# Patient Record
Sex: Male | Born: 1960 | Race: Black or African American | Hispanic: No | Marital: Single | Smoking: Never smoker
Health system: Southern US, Community
[De-identification: ages and names within clinical notes are randomized; demographics above are authoritative.]

## PROBLEM LIST (undated history)

## (undated) DIAGNOSIS — S76112A Strain of left quadriceps muscle, fascia and tendon, initial encounter: Secondary | ICD-10-CM

## (undated) DIAGNOSIS — G4730 Sleep apnea, unspecified: Secondary | ICD-10-CM

## (undated) DIAGNOSIS — I10 Essential (primary) hypertension: Secondary | ICD-10-CM

## (undated) DIAGNOSIS — S86891A Other injury of other muscle(s) and tendon(s) at lower leg level, right leg, initial encounter: Secondary | ICD-10-CM

## (undated) DIAGNOSIS — M549 Dorsalgia, unspecified: Secondary | ICD-10-CM

## (undated) HISTORY — PX: OTHER SURGICAL HISTORY: SHX169

## (undated) HISTORY — PX: KNEE SURGERY: SHX244

---

## 2008-02-23 ENCOUNTER — Emergency Department (HOSPITAL_COMMUNITY): Admission: EM | Admit: 2008-02-23 | Discharge: 2008-02-23 | Payer: Self-pay | Admitting: Emergency Medicine

## 2011-06-01 ENCOUNTER — Emergency Department (HOSPITAL_COMMUNITY)
Admission: EM | Admit: 2011-06-01 | Discharge: 2011-06-01 | Disposition: A | Discharge status: Home / Self Care | Payer: Medicaid - Out of State | Attending: Emergency Medicine | Admitting: Emergency Medicine

## 2011-06-01 ENCOUNTER — Emergency Department (HOSPITAL_COMMUNITY): Payer: Medicaid - Out of State

## 2011-06-01 DIAGNOSIS — M25469 Effusion, unspecified knee: Secondary | ICD-10-CM | POA: Insufficient documentation

## 2011-06-01 DIAGNOSIS — X500XXA Overexertion from strenuous movement or load, initial encounter: Secondary | ICD-10-CM | POA: Insufficient documentation

## 2011-06-01 DIAGNOSIS — S8990XA Unspecified injury of unspecified lower leg, initial encounter: Secondary | ICD-10-CM | POA: Insufficient documentation

## 2011-06-01 DIAGNOSIS — S99929A Unspecified injury of unspecified foot, initial encounter: Secondary | ICD-10-CM | POA: Insufficient documentation

## 2011-06-01 DIAGNOSIS — M79609 Pain in unspecified limb: Secondary | ICD-10-CM | POA: Insufficient documentation

## 2011-06-01 DIAGNOSIS — M25569 Pain in unspecified knee: Secondary | ICD-10-CM | POA: Insufficient documentation

## 2011-06-01 DIAGNOSIS — IMO0002 Reserved for concepts with insufficient information to code with codable children: Secondary | ICD-10-CM | POA: Insufficient documentation

## 2013-11-27 ENCOUNTER — Emergency Department (HOSPITAL_COMMUNITY)
Admission: EM | Admit: 2013-11-27 | Discharge: 2013-11-28 | Disposition: A | Discharge status: Home / Self Care | Payer: Medicaid - Out of State | Attending: Emergency Medicine | Admitting: Emergency Medicine

## 2013-11-27 ENCOUNTER — Encounter (HOSPITAL_COMMUNITY): Payer: Self-pay | Admitting: Emergency Medicine

## 2013-11-27 DIAGNOSIS — I10 Essential (primary) hypertension: Secondary | ICD-10-CM | POA: Insufficient documentation

## 2013-11-27 DIAGNOSIS — H6120 Impacted cerumen, unspecified ear: Secondary | ICD-10-CM | POA: Insufficient documentation

## 2013-11-27 DIAGNOSIS — J3489 Other specified disorders of nose and nasal sinuses: Secondary | ICD-10-CM | POA: Insufficient documentation

## 2013-11-27 DIAGNOSIS — Z79899 Other long term (current) drug therapy: Secondary | ICD-10-CM | POA: Insufficient documentation

## 2013-11-27 DIAGNOSIS — H6690 Otitis media, unspecified, unspecified ear: Secondary | ICD-10-CM

## 2013-11-27 HISTORY — DX: Essential (primary) hypertension: I10

## 2013-11-27 HISTORY — DX: Dorsalgia, unspecified: M54.9

## 2013-11-27 NOTE — ED Notes (Signed)
The patient said he has been working in the yard all day.  He said all of a sudden he started having ear pain.  It started as an ache now it is a throbbing, constant pain.  The patient rates his pain 8/10.  Patient denies injury to the left ear.  I did look in his ear, the right looks inflamed and the left I could not see well in the ear due to skin or wax in the ear.

## 2013-11-28 MED ORDER — ANTIPYRINE-BENZOCAINE 5.4-1.4 % OT SOLN: 3.0000 [drp] | OTIC | Status: DC | PRN

## 2013-11-28 MED ORDER — AMOXICILLIN 500 MG PO CAPS: 500.0000 mg | ORAL_CAPSULE | Freq: Three times a day (TID) | ORAL | Status: DC

## 2013-11-28 NOTE — Discharge Instructions (Signed)
Take antibiotic as prescribed.  Try auralgan for ear pain.  You can also take tylenol and/or motrin.  Return to ER if your pain worsens or you develop associated fever.

## 2013-11-28 NOTE — ED Provider Notes (Signed)
CSN: [id number]     Arrival date & time 11/27/13  2256 History   First MD Initiated Contact with Patient 11/27/13 2346     Chief Complaint  Patient presents with  . Otalgia     (Consider location/radiation/quality/duration/timing/severity/associated sxs/prior Treatment) HPI History provided by pt.   Pt had acute onset pain in right inner ear ~3.5 hours ago.  Associated w/ ear congestion and mild nasal congestion.  Denies fever, sore throat, cough, hearing impairment, otorrhea.  Denies trauma to ear but uses qtips.  Has never had these sx in the past.  Past Medical History  Diagnosis Date  . Hypertension   . Back pain    Past Surgical History  Procedure Laterality Date  . Heel spurs    . Plantar fascitis    . Knee surgery     History reviewed. No pertinent family history. History  Substance Use Topics  . Smoking status: Never Smoker   . Smokeless tobacco: Never Used  . Alcohol Use: No    Review of Systems  All other systems reviewed and are negative.     Allergies  Review of patient's allergies indicates no known allergies.  Home Medications   Current Outpatient Rx  Name  Route  Sig  Dispense  Refill  . lisinopril (PRINIVIL,ZESTRIL) 10 MG tablet   Oral   Take 10 mg by mouth daily.         . naproxen sodium (ANAPROX) 220 MG tablet   Oral   Take 220 mg by mouth 2 (two) times daily as needed (for pain).          BP 137/95  Pulse 84  Temp(Src) 98 F (36.7 C) (Oral)  Resp 18  Ht 5\' 9"  (1.753 m)  Wt 250 lb (113.399 kg)  BMI 36.90 kg/m2  SpO2 97% Physical Exam  Nursing note and vitals reviewed. Constitutional: He is oriented to person, place, and time. He appears well-developed and well-nourished. No distress.  HENT:  Head: Normocephalic and atraumatic.  Mouth/Throat: Oropharynx is clear and moist. No oropharyngeal exudate.  L TM and EAC nml.  Large amt cerumen R ear but when removed, TM deeply erythematous, particularly at 12 o'clock.  No sinus  tenderness  Eyes:  Normal appearance  Neck: Normal range of motion.  Cardiovascular: Normal rate.   Pulmonary/Chest: Effort normal and breath sounds normal.  Musculoskeletal: Normal range of motion.  Neurological: He is alert and oriented to person, place, and time.  Psychiatric: He has a normal mood and affect. His behavior is normal.    ED Course  Procedures (including critical care time) Labs Review Labs Reviewed - No data to display Imaging Review No results found.   EKG Interpretation None      MDM   Final diagnoses:  Otitis media    53yo M presents w/ non-traumatic R ear pain.  Exam consistent w/ AOM.  Prescribed amoxicillin and auralgan.  Recommended that he supplement for pain w/ tylenol/motrin prn. Return precautions discussed.     Arie SabinaCatherine E Lisabeth Mian, PA-C 11/28/13 1049

## 2013-11-29 NOTE — ED Provider Notes (Signed)
Medical screening examination/treatment/procedure(s) were performed by non-physician practitioner and as supervising physician I was immediately available for consultation/collaboration.   EKG Interpretation None        Loren Raceravid Lenward Able, MD 11/29/13 [id number]

## 2014-03-30 ENCOUNTER — Encounter (HOSPITAL_COMMUNITY): Payer: Self-pay | Admitting: Emergency Medicine

## 2014-03-30 ENCOUNTER — Emergency Department (HOSPITAL_COMMUNITY)
Admission: EM | Admit: 2014-03-30 | Discharge: 2014-03-30 | Disposition: A | Discharge status: Home / Self Care | Payer: Medicaid - Out of State | Attending: Emergency Medicine | Admitting: Emergency Medicine

## 2014-03-30 DIAGNOSIS — M545 Low back pain, unspecified: Secondary | ICD-10-CM | POA: Insufficient documentation

## 2014-03-30 DIAGNOSIS — I10 Essential (primary) hypertension: Secondary | ICD-10-CM | POA: Insufficient documentation

## 2014-03-30 MED ORDER — METHOCARBAMOL 500 MG PO TABS: 500.0000 mg | ORAL_TABLET | Freq: Two times a day (BID) | ORAL | Status: DC

## 2014-03-30 NOTE — ED Notes (Signed)
Pt presents with Right hip pain x4 days. Pt states he had XR's completed in [place] at his PCP but is here visiting. Pt drove here from [place] approx 1 week ago.

## 2014-03-30 NOTE — ED Provider Notes (Signed)
CSN: [id number]     Arrival date & time 03/30/14  1714 History  This chart was scribed for non-physician practitioner, Santiago GladHeather Kiyra Slaubaugh, PA-C working with Doug SouSam Jacubowitz, MD by Greggory StallionKayla Andersen, ED scribe. This patient was seen in room TR11C/TR11C and the patient's care was started at 6:46 PM.   Chief Complaint  Patient presents with  . Back Pain   The history is provided by the patient. No language interpreter was used.   HPI Comments: Logan Mccarthy is a 53 y.o. male who presents to the Emergency Department complaining of gradual onset right lower back pain that started about one week ago. Pain does not radiate. Denies injury but states he has been doing a lot of heavy lifting lately. States he had similar pain when he was 53 years old. Movement worsens the pain and causes it to be sharp. Laying on his back in a certain position relieves some pain. Pt has taken aleve with no relief. Denies dysuria, urinary frequency, hematuria, bowel or bladder incontinence, weakness, numbness or tingling.  No history of Cancer or IVDU.  Past Medical History  Diagnosis Date  . Hypertension   . Back pain    Past Surgical History  Procedure Laterality Date  . Heel spurs    . Plantar fascitis    . Knee surgery     History reviewed. No pertinent family history. History  Substance Use Topics  . Smoking status: Never Smoker   . Smokeless tobacco: Never Used  . Alcohol Use: No    Review of Systems  Genitourinary: Negative for dysuria, frequency and hematuria.       Negative for bowel or bladder incontinence.  Musculoskeletal: Positive for back pain.  Neurological: Negative for weakness and numbness.  All other systems reviewed and are negative.  Allergies  Review of patient's allergies indicates no known allergies.  Home Medications   Prior to Admission medications   Not on File   BP 129/72  Pulse 78  Temp(Src) 98.1 F (36.7 C) (Oral)  Resp 20  SpO2 97%  Physical Exam  Nursing note and  vitals reviewed. Constitutional: He appears well-developed and well-nourished.  HENT:  Head: Normocephalic and atraumatic.  Mouth/Throat: Oropharynx is clear and moist.  Eyes: EOM are normal.  Neck: Normal range of motion. Neck supple.  Cardiovascular: Normal rate, regular rhythm and normal heart sounds.   Pulmonary/Chest: Effort normal and breath sounds normal. He has no wheezes.  Musculoskeletal: Normal range of motion.  No tenderness to palpation of cervical, thoracic or lumbar spine. No tenderness to palpation of paraspinal muscles. Muscle strength normal. Distal sensation of both feet intact.  Neurological: He is alert. Gait normal.  Reflex Scores:      Patellar reflexes are 2+ on the right side and 2+ on the left side. Skin: Skin is warm and dry.  Psychiatric: He has a normal mood and affect. His behavior is normal.    ED Course  Procedures (including critical care time)  DIAGNOSTIC STUDIES: Oxygen Saturation is 97% on RA, normal by my interpretation.    COORDINATION OF CARE: 6:51 PM-Discussed treatment plan which includes a muscle relaxer and continuing aleve with pt at bedside and pt agreed to plan.   Labs Review Labs Reviewed - No data to display  Imaging Review No results found.   EKG Interpretation None      MDM   Final diagnoses:  None   MDM Number of Diagnoses or Management Options Right-sided low back pain without sciatica:  Patient  with back pain.  No neurological deficits and normal neuro exam.  Patient ambulating without difficulty.  No loss of bowel or bladder control.  No concern for cauda equina.  No fever, night sweats, weight loss, h/o cancer, IVDU.  RICE protocol and pain medicine indicated and discussed with patient.  Return precautions given.   I personally performed the services described in this documentation, which was scribed in my presence. The recorded information has been reviewed and is accurate.  Santiago Glad, PA-C 03/30/14  [phone]

## 2014-03-30 NOTE — Discharge Instructions (Signed)
Take muscle relaxer as needed.  Do not drive or operate heavy machinery for 4-6 hours after taking medication. °

## 2014-03-30 NOTE — ED Notes (Signed)
Patient discharged with all personal belongings. 

## 2014-03-30 NOTE — ED Notes (Signed)
Pt c/o pain to right lower back x's 8 days.  Pt also just drove from [place] and this made pain worse.  Pt denies tingling or numbness in legs.

## 2014-03-31 NOTE — ED Provider Notes (Signed)
Medical screening examination/treatment/procedure(s) were performed by non-physician practitioner and as supervising physician I was immediately available for consultation/collaboration.   EKG Interpretation None       Doug SouSam Jerusalem Brownstein, MD 03/31/14 0127

## 2014-08-17 DIAGNOSIS — Z86711 Personal history of pulmonary embolism: Secondary | ICD-10-CM

## 2014-08-17 HISTORY — DX: Personal history of pulmonary embolism: Z86.711

## 2015-06-21 ENCOUNTER — Encounter (HOSPITAL_COMMUNITY): Payer: Self-pay | Admitting: *Deleted

## 2015-06-21 ENCOUNTER — Inpatient Hospital Stay (HOSPITAL_COMMUNITY)
Admission: EM | Admit: 2015-06-21 | Discharge: 2015-06-24 | DRG: 176 | Disposition: A | Discharge status: Home / Self Care | Payer: Medicaid - Out of State | Attending: Internal Medicine | Admitting: Internal Medicine

## 2015-06-21 ENCOUNTER — Emergency Department (HOSPITAL_COMMUNITY): Payer: Medicaid - Out of State

## 2015-06-21 DIAGNOSIS — G4733 Obstructive sleep apnea (adult) (pediatric): Secondary | ICD-10-CM | POA: Diagnosis present

## 2015-06-21 DIAGNOSIS — Z79899 Other long term (current) drug therapy: Secondary | ICD-10-CM

## 2015-06-21 DIAGNOSIS — G4730 Sleep apnea, unspecified: Secondary | ICD-10-CM | POA: Diagnosis present

## 2015-06-21 DIAGNOSIS — N189 Chronic kidney disease, unspecified: Secondary | ICD-10-CM | POA: Diagnosis present

## 2015-06-21 DIAGNOSIS — R7989 Other specified abnormal findings of blood chemistry: Secondary | ICD-10-CM | POA: Diagnosis present

## 2015-06-21 DIAGNOSIS — E860 Dehydration: Secondary | ICD-10-CM | POA: Diagnosis present

## 2015-06-21 DIAGNOSIS — R778 Other specified abnormalities of plasma proteins: Secondary | ICD-10-CM | POA: Diagnosis present

## 2015-06-21 DIAGNOSIS — G894 Chronic pain syndrome: Secondary | ICD-10-CM | POA: Diagnosis present

## 2015-06-21 DIAGNOSIS — I2692 Saddle embolus of pulmonary artery without acute cor pulmonale: Principal | ICD-10-CM | POA: Diagnosis present

## 2015-06-21 DIAGNOSIS — R079 Chest pain, unspecified: Secondary | ICD-10-CM | POA: Diagnosis present

## 2015-06-21 DIAGNOSIS — I82403 Acute embolism and thrombosis of unspecified deep veins of lower extremity, bilateral: Secondary | ICD-10-CM | POA: Diagnosis present

## 2015-06-21 DIAGNOSIS — I10 Essential (primary) hypertension: Secondary | ICD-10-CM | POA: Diagnosis present

## 2015-06-21 DIAGNOSIS — M79606 Pain in leg, unspecified: Secondary | ICD-10-CM

## 2015-06-21 DIAGNOSIS — I129 Hypertensive chronic kidney disease with stage 1 through stage 4 chronic kidney disease, or unspecified chronic kidney disease: Secondary | ICD-10-CM | POA: Diagnosis present

## 2015-06-21 DIAGNOSIS — R0602 Shortness of breath: Secondary | ICD-10-CM | POA: Diagnosis present

## 2015-06-21 DIAGNOSIS — N179 Acute kidney failure, unspecified: Secondary | ICD-10-CM | POA: Diagnosis present

## 2015-06-21 DIAGNOSIS — M549 Dorsalgia, unspecified: Secondary | ICD-10-CM | POA: Diagnosis present

## 2015-06-21 DIAGNOSIS — R0600 Dyspnea, unspecified: Secondary | ICD-10-CM

## 2015-06-21 DIAGNOSIS — I2699 Other pulmonary embolism without acute cor pulmonale: Secondary | ICD-10-CM | POA: Insufficient documentation

## 2015-06-21 HISTORY — DX: Sleep apnea, unspecified: G47.30

## 2015-06-21 LAB — CBC
HCT: 42.8 % (ref 39.0–52.0)
Hemoglobin: 14.6 g/dL (ref 13.0–17.0)
MCH: 30.2 pg (ref 26.0–34.0)
MCHC: 34.1 g/dL (ref 30.0–36.0)
MCV: 88.6 fL (ref 78.0–100.0)
Platelets: 125 10*3/uL — ABNORMAL LOW (ref 150–400)
RBC: 4.83 MIL/uL (ref 4.22–5.81)
RDW: 13.4 % (ref 11.5–15.5)
WBC: 10.2 10*3/uL (ref 4.0–10.5)

## 2015-06-21 LAB — I-STAT TROPONIN, ED: Troponin i, poc: 0.11 ng/mL (ref 0.00–0.08)

## 2015-06-21 LAB — D-DIMER, QUANTITATIVE (NOT AT ARMC): D-Dimer, Quant: >20 ug/mL-FEU — ABNORMAL HIGH (ref 0.00–0.48)

## 2015-06-21 LAB — BASIC METABOLIC PANEL
Anion gap: 15 (ref 5–15)
BUN: 16 mg/dL (ref 6–20)
CO2: 26 mmol/L (ref 22–32)
Calcium: 9.8 mg/dL (ref 8.9–10.3)
Chloride: 99 mmol/L — ABNORMAL LOW (ref 101–111)
Creatinine, Ser: 1.36 mg/dL — ABNORMAL HIGH (ref 0.61–1.24)
GFR calc Af Amer: >60 mL/min (ref >60)
GFR calc non Af Amer: 58 mL/min — ABNORMAL LOW (ref >60)
Glucose, Bld: 143 mg/dL — ABNORMAL HIGH (ref 65–99)
Potassium: 4.6 mmol/L (ref 3.5–5.1)
Sodium: 140 mmol/L (ref 135–145)

## 2015-06-21 LAB — SEDIMENTATION RATE: Sed Rate: 1 mm/hr (ref 0–16)

## 2015-06-21 LAB — TROPONIN I: Troponin I: 0.13 ng/mL — ABNORMAL HIGH (ref <0.031)

## 2015-06-21 MED ORDER — ASPIRIN 81 MG PO CHEW
324.0000 mg | CHEWABLE_TABLET | Freq: Once | ORAL | Status: AC
  Administered 2015-06-21: 324 mg via ORAL
  Filled 2015-06-21: qty 4

## 2015-06-21 MED ORDER — NITROGLYCERIN 0.4 MG SL SUBL
0.4000 mg | SUBLINGUAL_TABLET | SUBLINGUAL | Status: DC | PRN
  Administered 2015-06-21: 0.4 mg via SUBLINGUAL
  Filled 2015-06-21: qty 1
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
## 2015-06-21 NOTE — ED Provider Notes (Addendum)
11:06 PM  Took over patient for the last provider. Patient 54 year old male with hypertension increasing shortness of breath and dyspnea on walking. Patient takes long plane flights. D-dimer pending. Initially mildly elevated troponin. I repeated EKG. On EKG looks like  there is ST elevations in V2 to V3.. [facility]. They agree. I would also like to scan patient given his risk factors, troponin, an elevated d-dimer. They recommended holding off on the scan currently so he does not receive two dye loads.  Patient will received heparin either way. Patient is chest pain-free right now. I'm still concerned about a pulmonary embolism but agree with cardiology recommendation abut avoidign renal damage by double dye load.   11:53 PM Cardiac he saw patient. They agreed to go ahead with the CT PE study. We'll plan the study. In the meantime we'll heparinize patient.    Courteney Randall AnLyn Mackuen, MD 06/21/15 2309  Courteney Randall AnLyn Mackuen, MD 06/21/15 2354  12:17 AM Patient refusing IV placement. IV not large enough for CT.  Will treat with heparin PE admit for VQ scan with cards following.   Courteney Randall AnLyn Mackuen, MD 06/22/15 0020

## 2015-06-21 NOTE — ED Provider Notes (Signed)
CSN: [id number]     Arrival date & time 06/21/15  1837 History   First MD Initiated Contact with Patient 06/21/15 1903     Chief Complaint  Patient presents with  . Shortness of Breath  . Chest Pain      HPI  Patient presents for evaluation of shortness of breath. He states that he was wife normally walk at country [address], about a three-quarter of a mile loop.  He normally walks 2-3 labs without difficulty. For the last 2-3 days he's had difficulty with his [place] shortness of breath. States it limits him he has to stop for 5 times each lap. Yesterday started having some mild central chest discomfort. He denies pressure or pain. It is nonradiating. It is been almost constant since starting yesterday. He cannot associated with activity. However his dyspnea is definitely dyspnea on exertion. No PND or orthopnea. No leg swelling. He does work as a Geologist, engineering and states every week he is driving back and forth to [place] or [place] usually 6-10 hour trips. States he had some stiffness and soreness in his leg a few days ago with his walk but none now. No swelling in either extremity.  Treatment hypertension, taking Norvasc and Hyzaar. His nonsmoker. No family history of heart disease. No history of DVT or PE for heat, or his family.  He is a nonsmoker. He denies illicit drug use.  Past Medical History  Diagnosis Date  . Hypertension   . Back pain   . Sleep apnea     CPAP   Past Surgical History  Procedure Laterality Date  . Heel spurs    . Plantar fascitis    . Knee surgery Right    Family History  Problem Relation Age of Onset  . Hypertension Mother    Social History  Substance Use Topics  . Smoking status: Never Smoker   . Smokeless tobacco: Never Used  . Alcohol Use: No    Review of Systems  Constitutional: Negative for fever, chills, diaphoresis, appetite change and fatigue.  HENT: Negative for mouth sores, sore throat and trouble swallowing.   Eyes: Negative for visual disturbance.   Respiratory: Positive for shortness of breath. Negative for cough, chest tightness and wheezing.   Cardiovascular: Positive for chest pain.  Gastrointestinal: Negative for nausea, vomiting, abdominal pain, diarrhea and abdominal distention.  Endocrine: Negative for polydipsia, polyphagia and polyuria.  Genitourinary: Negative for dysuria, frequency and hematuria.  Musculoskeletal: Negative for gait problem.  Skin: Negative for color change, pallor and rash.  Neurological: Negative for dizziness, syncope, light-headedness and headaches.  Hematological: Does not bruise/bleed easily.  Psychiatric/Behavioral: Negative for behavioral problems and confusion.      Allergies  Review of patient's allergies indicates no known allergies.  Home Medications   Prior to Admission medications   Medication Sig Start Date End Date Taking? Authorizing Provider  amLODipine (NORVASC) 10 MG tablet Take 10 mg by mouth daily.   Yes Historical Provider, MD  oxyCODONE (OXYCONTIN) 60 MG 12 hr tablet Take 60 mg by mouth every 12 (twelve) hours.   Yes Historical Provider, MD  oxycodone (ROXICODONE) 30 MG immediate release tablet Take 30 mg by mouth every 8 (eight) hours as needed for pain.   Yes Historical Provider, MD  methocarbamol (ROBAXIN) 500 MG tablet Take 1 tablet (500 mg total) by mouth 2 (two) times daily. 03/30/14   Santiago Glad, PA-C  Rivaroxaban (XARELTO) 15 MG TABS tablet Take 1 tablet (15 mg total) by mouth 2 (  two) times daily. LAST DOSE AT 11/26 06/24/15   Shanker Levora Dredge, MD  rivaroxaban (XARELTO) 20 MG TABS tablet Take 1 tablet (20 mg total) by mouth daily with supper. Start on 07/14/15 07/14/15   Shanker Levora Dredge, MD   BP 134/82 mmHg  Pulse 69  Temp(Src) 98.4 F (36.9 C) (Oral)  Resp 18  Ht  (1.753 m)  Wt 265 lb 9.6 oz (120.475 kg)  BMI 39.20 kg/m2  SpO2 99% Physical Exam  Constitutional: He is oriented to person, place, and time. He appears well-developed and well-nourished. No  distress.  HENT:  Head: Normocephalic.  Eyes: Conjunctivae are normal. Pupils are equal, round, and reactive to light. No scleral icterus.  Neck: Normal range of motion. Neck supple. No thyromegaly present.  Cardiovascular: Normal rate and regular rhythm.  Exam reveals no gallop and no friction rub.   No murmur heard. Pulmonary/Chest: Effort normal and breath sounds normal. No respiratory distress. He has no wheezes. He has no rales.  Abdominal: Soft. Bowel sounds are normal. He exhibits no distension. There is no tenderness. There is no rebound.  Musculoskeletal: Normal range of motion.  Neurological: He is alert and oriented to person, place, and time.  Skin: Skin is warm and dry. No rash noted.  Psychiatric: He has a normal mood and affect. His behavior is normal.    ED Course  Procedures (including critical care time) Labs Review Labs Reviewed  BASIC METABOLIC PANEL - Abnormal; Notable for the following:    Chloride 99 (*)    Glucose, Bld 143 (*)    Creatinine, Ser 1.36 (*)    GFR calc non Af Amer 58 (*)    All other components within normal limits  CBC - Abnormal; Notable for the following:    Platelets 125 (*)    All other components within normal limits  TROPONIN I - Abnormal; Notable for the following:    Troponin I 0.13 (*)    All other components within normal limits  D-DIMER, QUANTITATIVE (NOT AT [facility]) - Abnormal; Notable for the following:    D-Dimer, Quant >20.00 (*)    All other components within normal limits  APTT - Abnormal; Notable for the following:    aPTT 113 (*)    All other components within normal limits  CBC - Abnormal; Notable for the following:    WBC 11.2 (*)    Platelets 113 (*)    All other components within normal limits  COMPREHENSIVE METABOLIC PANEL - Abnormal; Notable for the following:    CO2 20 (*)    Calcium 8.2 (*)    Total Protein 6.1 (*)    Albumin 3.3 (*)    All other components within normal limits  HEMOGLOBIN A1C - Abnormal;  Notable for the following:    Hgb A1c MFr Bld 6.6 (*)    All other components within normal limits  LIPID PANEL - Abnormal; Notable for the following:    LDL Cholesterol 108 (*)    All other components within normal limits  PROTIME-INR - Abnormal; Notable for the following:    Prothrombin Time 15.3 (*)    All other components within normal limits  TROPONIN I - Abnormal; Notable for the following:    Troponin I 0.06 (*)    All other components within normal limits  TROPONIN I - Abnormal; Notable for the following:    Troponin I 0.04 (*)    All other components within normal limits  TROPONIN I - Abnormal;  Notable for the following:    Troponin I 0.04 (*)    All other components within normal limits  GLUCOSE, CAPILLARY - Abnormal; Notable for the following:    Glucose-Capillary 115 (*)    All other components within normal limits  HEPARIN LEVEL (UNFRACTIONATED) - Abnormal; Notable for the following:    Heparin Unfractionated 0.27 (*)    All other components within normal limits  BASIC METABOLIC PANEL - Abnormal; Notable for the following:    Glucose, Bld 123 (*)    Calcium 7.9 (*)    All other components within normal limits  CBC - Abnormal; Notable for the following:    RBC 4.12 (*)    Hemoglobin 12.7 (*)    HCT 37.3 (*)    Platelets 105 (*)    All other components within normal limits  GLUCOSE, CAPILLARY - Abnormal; Notable for the following:    Glucose-Capillary 106 (*)    All other components within normal limits  GLUCOSE, CAPILLARY - Abnormal; Notable for the following:    Glucose-Capillary 146 (*)    All other components within normal limits  I-STAT TROPOININ, ED - Abnormal; Notable for the following:    Troponin i, poc 0.11 (*)    All other components within normal limits  SEDIMENTATION RATE  BRAIN NATRIURETIC PEPTIDE  URINE RAPID DRUG SCREEN, HOSP PERFORMED  CREATININE, URINE, RANDOM  UREA NITROGEN, URINE  HEPARIN LEVEL (UNFRACTIONATED)  HEPARIN LEVEL  (UNFRACTIONATED)    Imaging Review No results found. I have personally reviewed and evaluated these images and lab results as part of my medical decision-making.   EKG Interpretation   Date/Time:  Friday June 21 2015 22:35:04 EDT Ventricular Rate:  81 PR Interval:  149 QRS Duration: 89 QT Interval:  375 QTC Calculation: 435 R Axis:   -70 Text Interpretation:  Sinus rhythm Left anterior fascicular block Abnormal  R-wave progression, late transition ED PHYSICIAN INTERPRETATION AVAILABLE  IN [facility] Confirmed by TEST, Record ([id number]) on 06/22/2015 9:20:12  AM      MDM   Final diagnoses:  SOB (shortness of breath)    EKG without ST or T-wave changes. No Q waves. Mild elevation of initial troponin. Formal lab troponin pending. D-dimer pending. Pt is given aspirin and nitroglycerin.  Will reevaluate.    Rolland PorterMark Junie Engram, MD 07/11/15 [id number]

## 2015-06-21 NOTE — ED Notes (Signed)
Pt reports not feeling well x 3 days, has tightness to left lower leg, sob and mild chest pain. ekg done at triage, no acute distress noted.

## 2015-06-22 ENCOUNTER — Inpatient Hospital Stay (HOSPITAL_COMMUNITY): Payer: Medicaid - Out of State

## 2015-06-22 ENCOUNTER — Encounter (HOSPITAL_COMMUNITY): Payer: Self-pay | Admitting: Radiology

## 2015-06-22 DIAGNOSIS — I2699 Other pulmonary embolism without acute cor pulmonale: Secondary | ICD-10-CM

## 2015-06-22 DIAGNOSIS — R7989 Other specified abnormal findings of blood chemistry: Secondary | ICD-10-CM | POA: Diagnosis present

## 2015-06-22 DIAGNOSIS — M549 Dorsalgia, unspecified: Secondary | ICD-10-CM | POA: Diagnosis present

## 2015-06-22 DIAGNOSIS — N189 Chronic kidney disease, unspecified: Secondary | ICD-10-CM | POA: Diagnosis present

## 2015-06-22 DIAGNOSIS — G894 Chronic pain syndrome: Secondary | ICD-10-CM | POA: Diagnosis present

## 2015-06-22 DIAGNOSIS — G4730 Sleep apnea, unspecified: Secondary | ICD-10-CM | POA: Diagnosis present

## 2015-06-22 DIAGNOSIS — G4733 Obstructive sleep apnea (adult) (pediatric): Secondary | ICD-10-CM | POA: Diagnosis present

## 2015-06-22 DIAGNOSIS — I2692 Saddle embolus of pulmonary artery without acute cor pulmonale: Secondary | ICD-10-CM | POA: Diagnosis present

## 2015-06-22 DIAGNOSIS — R079 Chest pain, unspecified: Secondary | ICD-10-CM | POA: Diagnosis present

## 2015-06-22 DIAGNOSIS — I10 Essential (primary) hypertension: Secondary | ICD-10-CM | POA: Diagnosis present

## 2015-06-22 DIAGNOSIS — I129 Hypertensive chronic kidney disease with stage 1 through stage 4 chronic kidney disease, or unspecified chronic kidney disease: Secondary | ICD-10-CM | POA: Diagnosis present

## 2015-06-22 DIAGNOSIS — E860 Dehydration: Secondary | ICD-10-CM | POA: Diagnosis present

## 2015-06-22 DIAGNOSIS — R778 Other specified abnormalities of plasma proteins: Secondary | ICD-10-CM | POA: Diagnosis present

## 2015-06-22 DIAGNOSIS — M79606 Pain in leg, unspecified: Secondary | ICD-10-CM

## 2015-06-22 DIAGNOSIS — N179 Acute kidney failure, unspecified: Secondary | ICD-10-CM | POA: Diagnosis present

## 2015-06-22 DIAGNOSIS — R0602 Shortness of breath: Secondary | ICD-10-CM | POA: Diagnosis present

## 2015-06-22 DIAGNOSIS — I82403 Acute embolism and thrombosis of unspecified deep veins of lower extremity, bilateral: Secondary | ICD-10-CM | POA: Diagnosis present

## 2015-06-22 DIAGNOSIS — Z79899 Other long term (current) drug therapy: Secondary | ICD-10-CM | POA: Diagnosis not present

## 2015-06-22 LAB — COMPREHENSIVE METABOLIC PANEL
ALT: 23 U/L (ref 17–63)
AST: 18 U/L (ref 15–41)
Albumin: 3.3 g/dL — ABNORMAL LOW (ref 3.5–5.0)
Alkaline Phosphatase: 55 U/L (ref 38–126)
Anion gap: 10 (ref 5–15)
BUN: 15 mg/dL (ref 6–20)
CO2: 20 mmol/L — ABNORMAL LOW (ref 22–32)
Calcium: 8.2 mg/dL — ABNORMAL LOW (ref 8.9–10.3)
Chloride: 108 mmol/L (ref 101–111)
Creatinine, Ser: 1.09 mg/dL (ref 0.61–1.24)
GFR calc Af Amer: >60 mL/min (ref >60)
GFR calc non Af Amer: >60 mL/min (ref >60)
Glucose, Bld: 94 mg/dL (ref 65–99)
Potassium: 4.2 mmol/L (ref 3.5–5.1)
Sodium: 138 mmol/L (ref 135–145)
Total Bilirubin: 0.8 mg/dL (ref 0.3–1.2)
Total Protein: 6.1 g/dL — ABNORMAL LOW (ref 6.5–8.1)

## 2015-06-22 LAB — GLUCOSE, CAPILLARY: Glucose-Capillary: 115 mg/dL — ABNORMAL HIGH (ref 65–99)

## 2015-06-22 LAB — RAPID URINE DRUG SCREEN, HOSP PERFORMED
Amphetamines: NOT DETECTED
Barbiturates: NOT DETECTED
Benzodiazepines: NOT DETECTED
Cocaine: NOT DETECTED
Opiates: NOT DETECTED
Tetrahydrocannabinol: NOT DETECTED

## 2015-06-22 LAB — LIPID PANEL
Cholesterol: 168 mg/dL (ref 0–200)
HDL: 45 mg/dL (ref >40)
LDL Cholesterol: 108 mg/dL — ABNORMAL HIGH (ref 0–99)
Total CHOL/HDL Ratio: 3.7 RATIO
Triglycerides: 77 mg/dL (ref <150)
VLDL: 15 mg/dL (ref 0–40)

## 2015-06-22 LAB — CBC
HCT: 39.7 % (ref 39.0–52.0)
Hemoglobin: 13.8 g/dL (ref 13.0–17.0)
MCH: 30.7 pg (ref 26.0–34.0)
MCHC: 34.8 g/dL (ref 30.0–36.0)
MCV: 88.4 fL (ref 78.0–100.0)
Platelets: 113 10*3/uL — ABNORMAL LOW (ref 150–400)
RBC: 4.49 MIL/uL (ref 4.22–5.81)
RDW: 13.5 % (ref 11.5–15.5)
WBC: 11.2 10*3/uL — ABNORMAL HIGH (ref 4.0–10.5)

## 2015-06-22 LAB — TROPONIN I
Troponin I: 0.06 ng/mL — ABNORMAL HIGH (ref <0.031)
Troponin I: 0.04 ng/mL — ABNORMAL HIGH (ref <0.031)
Troponin I: 0.04 ng/mL — ABNORMAL HIGH (ref <0.031)

## 2015-06-22 LAB — PROTIME-INR
INR: 1.19 (ref 0.00–1.49)
Prothrombin Time: 15.3 seconds — ABNORMAL HIGH (ref 11.6–15.2)

## 2015-06-22 LAB — HEPARIN LEVEL (UNFRACTIONATED)
Heparin Unfractionated: 0.47 IU/mL (ref 0.30–0.70)
Heparin Unfractionated: 0.37 IU/mL (ref 0.30–0.70)

## 2015-06-22 LAB — APTT: aPTT: 113 seconds — ABNORMAL HIGH (ref 24–37)

## 2015-06-22 LAB — CREATININE, URINE, RANDOM: Creatinine, Urine: 207.49 mg/dL

## 2015-06-22 LAB — BRAIN NATRIURETIC PEPTIDE: B Natriuretic Peptide: 67.8 pg/mL (ref 0.0–100.0)

## 2015-06-22 MED ORDER — HEPARIN (PORCINE) IN NACL 100-0.45 UNIT/ML-% IJ SOLN
1800.0000 [IU]/h | INTRAMUSCULAR | Status: DC
  Administered 2015-06-22: 1600 [IU]/h via INTRAVENOUS
  Filled 2015-06-22 (×3): qty 250

## 2015-06-22 MED ORDER — ACETAMINOPHEN 650 MG RE SUPP: 650.0000 mg | Freq: Four times a day (QID) | RECTAL | Status: DC | PRN

## 2015-06-22 MED ORDER — SODIUM CHLORIDE 0.9 % IJ SOLN
3.0000 mL | Freq: Two times a day (BID) | INTRAMUSCULAR | Status: DC
  Administered 2015-06-23 – 2015-06-24 (×2): 3 mL via INTRAVENOUS

## 2015-06-22 MED ORDER — OXYCODONE HCL ER 10 MG PO T12A
60.0000 mg | EXTENDED_RELEASE_TABLET | Freq: Two times a day (BID) | ORAL | Status: DC
  Filled 2015-06-22: qty 6

## 2015-06-22 MED ORDER — HYDRALAZINE HCL 20 MG/ML IJ SOLN: 5.0000 mg | INTRAMUSCULAR | Status: DC | PRN

## 2015-06-22 MED ORDER — ONDANSETRON HCL 4 MG PO TABS: 4.0000 mg | ORAL_TABLET | Freq: Four times a day (QID) | ORAL | Status: DC | PRN

## 2015-06-22 MED ORDER — OXYCODONE HCL 5 MG PO TABS
30.0000 mg | ORAL_TABLET | Freq: Three times a day (TID) | ORAL | Status: DC | PRN
  Administered 2015-06-22 (×2): 30 mg via ORAL
  Filled 2015-06-22 (×2): qty 6

## 2015-06-22 MED ORDER — SODIUM CHLORIDE 0.9 % IV SOLN
INTRAVENOUS | Status: DC
  Administered 2015-06-22 – 2015-06-23 (×2): 100 mL/h via INTRAVENOUS

## 2015-06-22 MED ORDER — ALUM & MAG HYDROXIDE-SIMETH 200-200-20 MG/5ML PO SUSP: 30.0000 mL | Freq: Four times a day (QID) | ORAL | Status: DC | PRN

## 2015-06-22 MED ORDER — METHOCARBAMOL 500 MG PO TABS
500.0000 mg | ORAL_TABLET | Freq: Two times a day (BID) | ORAL | Status: DC
  Administered 2015-06-22 – 2015-06-24 (×5): 500 mg via ORAL
  Filled 2015-06-22 (×5): qty 1

## 2015-06-22 MED ORDER — ACETAMINOPHEN 325 MG PO TABS
650.0000 mg | ORAL_TABLET | Freq: Four times a day (QID) | ORAL | Status: DC | PRN
  Administered 2015-06-23: 650 mg via ORAL
  Filled 2015-06-22: qty 2

## 2015-06-22 MED ORDER — SODIUM CHLORIDE 0.9 % IV BOLUS (SEPSIS)
500.0000 mL | Freq: Once | INTRAVENOUS | Status: AC
  Administered 2015-06-22: 500 mL via INTRAVENOUS

## 2015-06-22 MED ORDER — MORPHINE SULFATE (PF) 2 MG/ML IV SOLN: 2.0000 mg | INTRAVENOUS | Status: DC | PRN

## 2015-06-22 MED ORDER — ATORVASTATIN CALCIUM 40 MG PO TABS
40.0000 mg | ORAL_TABLET | Freq: Every day | ORAL | Status: DC
  Administered 2015-06-22 – 2015-06-23 (×2): 40 mg via ORAL
  Filled 2015-06-22 (×2): qty 1

## 2015-06-22 MED ORDER — AMLODIPINE BESYLATE 10 MG PO TABS
10.0000 mg | ORAL_TABLET | Freq: Every day | ORAL | Status: DC
  Administered 2015-06-22 – 2015-06-24 (×3): 10 mg via ORAL
  Filled 2015-06-22 (×3): qty 1

## 2015-06-22 MED ORDER — ONDANSETRON HCL 4 MG/2ML IJ SOLN: 4.0000 mg | Freq: Four times a day (QID) | INTRAMUSCULAR | Status: DC | PRN

## 2015-06-22 MED ORDER — OXYCODONE HCL ER 60 MG PO T12A: 60.0000 mg | EXTENDED_RELEASE_TABLET | Freq: Two times a day (BID) | ORAL | Status: DC

## 2015-06-22 MED ORDER — HEPARIN BOLUS VIA INFUSION
5000.0000 [IU] | Freq: Once | INTRAVENOUS | Status: AC
  Administered 2015-06-22: 5000 [IU] via INTRAVENOUS
  Filled 2015-06-22: qty 5000

## 2015-06-22 MED ORDER — IOHEXOL 350 MG/ML SOLN
100.0000 mL | Freq: Once | INTRAVENOUS | Status: AC | PRN
  Administered 2015-06-22: 100 mL via INTRAVENOUS

## 2015-06-22 MED ORDER — POLYETHYLENE GLYCOL 3350 17 G PO PACK
17.0000 g | PACK | Freq: Every day | ORAL | Status: DC
  Administered 2015-06-24: 17 g via ORAL
  Filled 2015-06-22: qty 1

## 2015-06-22 MED ORDER — HEPARIN BOLUS VIA INFUSION
4000.0000 [IU] | Freq: Once | INTRAVENOUS | Status: DC
  Filled 2015-06-22: qty 4000

## 2015-06-22 MED ORDER — ASPIRIN 325 MG PO TABS
325.0000 mg | ORAL_TABLET | Freq: Every day | ORAL | Status: DC
  Administered 2015-06-22: 325 mg via ORAL
  Filled 2015-06-22: qty 1

## 2015-06-22 NOTE — Progress Notes (Signed)
Patient refuses to have labs drawn, states he it is hard to get his veins and he will not allow lab to continue to stick him, explained to him in detail of the need for the heparin gtt and with the diagnosis of possible PE , it is imperative for his health. He states the MD will have to come up with another treatment that does not require lab draws to treat. Craige CottaKirby NP paged to make aware and see what other options are available. [facility] Enmanuel [employer]

## 2015-06-22 NOTE — Progress Notes (Signed)
PATIENT DETAILS Name: Rosezella RumpfDwayne Finlay Age: 54 y.o. Sex: male Date of Birth: 03/22/1961 Admit Date: 06/21/2015 Admitting Physician Lorretta HarpXilin Niu, MD UJW:[id number]:PROVIDER NOT IN SYSTEM  Subjective: Feels better-no chest pain at rest. No shortness of breath at rest.  Assessment/Plan: Principal Problem: Chest pain/exertional dyspnea: Likely secondary to pulmonary embolism-as patient has extensive bilateral lower extremity Doppler. Continue IV heparin, since renal function has normalized-Will go ahead and get a CT angiogram of the chest to assess clot burden. Clinically no signs of RV strain-depending on CT chest findings-may need consult pulmonology or interventional radiology for catheter guided lysis. Await echocardiogram. Spoke with cardiology, suspect with this new information-no need for cardiology intervention including LHC.  Active Problems: DVT/suspected PE: See above-continue heparin. Likely provoked VTE by patient's extensive travel. Once CT angiogram is complete, and felt not to require any further interventions-Will convert to oral anticoagulation.  Minimally elevated troponin: Likely secondary to pulmonary embolism. Await echocardiogram. Supportive care.  AKI: Likely prerenal azotemia from losartan/HCTZ use-resolved with hydration.  Hypertension: Cautiously continue with amlodipine-continue to hold losartan and HCTZ.  Chronic pain syndrome/chronic back pain: Continue with narcotics. Start MiraLAX for bowel regimen  OSA:in the setting of obesity-continue CPAP  Disposition: Remain inpatient  Antimicrobial agents  See below  Anti-infectives    None      DVT Prophylaxis: IV Heparin   Code Status: Full code   Family Communication None at bedside  Procedures: None  CONSULTS:  cardiology  Time spent 30 minutes-Greater than 50% of this time was spent in counseling, explanation of diagnosis, planning of further management, and coordination of  care.  MEDICATIONS: Scheduled Meds: . amLODipine  10 mg Oral Daily  . aspirin  325 mg Oral Daily  . atorvastatin  40 mg Oral q1800  . methocarbamol  500 mg Oral BID  . oxyCODONE  60 mg Oral Q12H  . sodium chloride  3 mL Intravenous Q12H   Continuous Infusions: . sodium chloride 100 mL/hr at 06/22/15 1100  . heparin 1,600 Units/hr (06/22/15 1100)   PRN Meds:.acetaminophen **OR** acetaminophen, alum & mag hydroxide-simeth, hydrALAZINE, morphine injection, nitroGLYCERIN, ondansetron **OR** ondansetron (ZOFRAN) IV, oxycodone    PHYSICAL EXAM: Vital signs in last 24 hours: Filed Vitals:   06/22/15 0030 06/22/15 0202 06/22/15 0602 06/22/15 1013  BP:  137/79 116/78 116/78  Pulse: 88 79 75   Temp:  98.8 F (37.1 C) 98.3 F (36.8 C)   TempSrc:  Oral Oral   Resp: 20 18 18    Height:  5\' 9"  (1.753 m)    Weight:  117.346 kg (258 lb 11.2 oz)    SpO2: 94% 92% 98%     Weight change:  Filed Weights   06/21/15 1847 06/22/15 0202  Weight: 120.203 kg (265 lb) 117.346 kg (258 lb 11.2 oz)   Body mass index is 38.19 kg/(m^2).   Gen Exam: Awake and alert with clear speech.  Neck: Supple, No JVD.   Chest: B/L Clear.   CVS: S1 S2 Regular, no murmurs.  Abdomen: soft, BS +, non tender, non distended.  Extremities: no edema, lower extremities warm to touch Neurologic: Non Focal.   Skin: No Rash.   Wounds: N/A.    Intake/Output from previous day:  Intake/Output Summary (Last 24 hours) at 06/22/15 1118 Last data filed at 06/22/15 1000  Gross per 24 hour  Intake 982.87 ml  Output    675 ml  Net 307.87 ml  LAB RESULTS: CBC  Recent Labs Lab 06/21/15 1902 06/22/15 0406  WBC 10.2 11.2*  HGB 14.6 13.8  HCT 42.8 39.7  PLT 125* 113*  MCV 88.6 88.4  MCH 30.2 30.7  MCHC 34.1 34.8  RDW 13.4 13.5    Chemistries   Recent Labs Lab 06/21/15 1902 06/22/15 0406  NA 140 138  K 4.6 4.2  CL 99* 108  CO2 26 20*  GLUCOSE 143* 94  BUN 16 15  CREATININE 1.36* 1.09  CALCIUM  9.8 8.2*    CBG:  Recent Labs Lab 06/22/15 0736  GLUCAP 115*    GFR Estimated Creatinine Clearance: 97.9 mL/min (by C-G formula based on Cr of 1.09).  Coagulation profile  Recent Labs Lab 06/22/15 0406  INR 1.19    Cardiac Enzymes  Recent Labs Lab 06/21/15 2100 06/22/15 0406  TROPONINI 0.13* 0.06*    Invalid input(s): POCBNP  Recent Labs  06/21/15 2100  DDIMER >20.00*   No results for input(s): HGBA1C in the last 72 hours.  Recent Labs  06/22/15 0407  CHOL 168  HDL 45  LDLCALC 108*  TRIG 77  CHOLHDL 3.7   No results for input(s): TSH, T4TOTAL, T3FREE, THYROIDAB in the last 72 hours.  Invalid input(s): FREET3 No results for input(s): VITAMINB12, FOLATE, FERRITIN, TIBC, IRON, RETICCTPCT in the last 72 hours. No results for input(s): LIPASE, AMYLASE in the last 72 hours.  Urine Studies No results for input(s): UHGB, CRYS in the last 72 hours.  Invalid input(s): UACOL, UAPR, USPG, UPH, UTP, UGL, UKET, UBIL, UNIT, UROB, ULEU, UEPI, UWBC, URBC, UBAC, CAST, UCOM, BILUA  MICROBIOLOGY: No results found for this or any previous visit (from the past 240 hour(s)).  RADIOLOGY STUDIES/RESULTS: Dg Chest 2 View  06/21/2015  CLINICAL DATA:  Chest pain with shortness of breath.  Left leg pain. EXAM: CHEST  2 VIEW COMPARISON:  None. FINDINGS: The heart size and mediastinal contours are within normal limits. Both lungs are clear. No pleural effusion or pneumothorax. The visualized skeletal structures are unremarkable. IMPRESSION: No active cardiopulmonary disease. Electronically Signed   By: Amie Portland M.D.   On: 06/21/2015 19:59   US Renal  06/22/2015  CLINICAL DATA:  Hypertension.  Pain. EXAM: RENAL / URINARY TRACT ULTRASOUND COMPLETE COMPARISON:  None. FINDINGS: Right Kidney: Length: 11.1 cm. Echogenicity within normal limits. No mass or hydronephrosis visualized. Left Kidney: Length: 13.6 cm . Echogenicity within normal limits. 3 cm lower pole cyst. No  suspicious mass or hydronephrosis visualized. Bladder: Appears normal for degree of bladder distention. IMPRESSION: No significant abnormality Electronically Signed   By: Ellery Plunk M.D.   On: 06/22/2015 02:47    Jeoffrey Massed, MD  [facility] Pager:[phone]  If 7PM-7AM, please contact night-coverage [email or web address] Password [email or web address] 06/22/2015, 11:18 AM   LOS: 0 days

## 2015-06-22 NOTE — Progress Notes (Signed)
ANTICOAGULATION CONSULT NOTE  Pharmacy Consult for Heparin Indication: chest pain/ACS  No Known Allergies  Patient Measurements: Height: 5\' 9"  (175.3 cm) Weight: 258 lb 11.2 oz (117.346 kg) IBW/kg (Calculated) : 70.7 Heparin Dosing Weight: 98 kg  Vital Signs: Temp: 98.3 F (36.8 C) (11/05 0602) Temp Source: Oral (11/05 0602) BP: 116/78 mmHg (11/05 0602) Pulse Rate: 75 (11/05 0602)  Labs:  Recent Labs  06/21/15 1902 06/21/15 2100 06/22/15 0406  HGB 14.6  --  13.8  HCT 42.8  --  39.7  PLT 125*  --  113*  APTT  --   --  113*  LABPROT  --   --  15.3*  INR  --   --  1.19  CREATININE 1.36*  --  1.09  TROPONINI  --  0.13* 0.06*    Estimated Creatinine Clearance: 97.9 mL/min (by C-G formula based on Cr of 1.09).   Medical History: Past Medical History  Diagnosis Date  . Hypertension   . Back pain   . Sleep apnea     CPAP    Medications:  Prescriptions prior to admission  Medication Sig Dispense Refill Last Dose  . amLODipine (NORVASC) 10 MG tablet Take 10 mg by mouth daily.   06/20/2015 at Unknown time  . losartan-hydrochlorothiazide (HYZAAR) 100-25 MG tablet Take 1 tablet by mouth daily.   06/20/2015 at Unknown time  . oxyCODONE (OXYCONTIN) 60 MG 12 hr tablet Take 60 mg by mouth every 12 (twelve) hours.   06/21/2015 at Unknown time  . oxycodone (ROXICODONE) 30 MG immediate release tablet Take 30 mg by mouth every 8 (eight) hours as needed for pain.   Past Week at Unknown time  . methocarbamol (ROBAXIN) 500 MG tablet Take 1 tablet (500 mg total) by mouth 2 (two) times daily. 20 tablet 0     Assessment: 54 YOM with shortness of breath and slight chest pain on presentation to the ED. Troponin mildly elevated. Pharmacy consulted to start IV heparin for ACS, now for r/o PE and bolus/rate increased. Patient had driven for 4 hours 5 days ago. V/Q scan, dopplers ordered to r/o DVT/PE. H/H wnl. Plt 113K. He was not on any anticoagulation prior to admission. Patient refusing  lab draws and HL moved back several hours - may need to switch agents.  HL therapeutic (0.47) on 1600 units/h. No bleed documented  Goal of Therapy:  Heparin level 0.3-0.7 units/ml Monitor platelets by anticoagulation protocol: Yes   Plan:  Heparin @1600  units/h 6h HL to confirm Daily HL/CBC Mon s/sx bleeding F/u V/Q scan, dopplers  Babs BertinHaley Shalice Woodring, PharmD Clinical Pharmacist Pager [phone] 06/22/2015 7:44 AM

## 2015-06-22 NOTE — Progress Notes (Signed)
Patient results from AM doppler study called to RN, positive bilateral leg DVTS. Called and spoke with Dr. Jerral RalphGhimire about results, VQ scan was d'cd per verbal order. Will continue to monitor pt. Currently on Heparin gtt at 1600 units, MD aware.

## 2015-06-22 NOTE — Progress Notes (Signed)
Spoke with Dr. Jerral RalphGhimire. Pt being treated for DVT and presumptive PE. Awaiting CT angiography of the chest today.  Cardiology will sign off.

## 2015-06-22 NOTE — H&P (Signed)
[facility] History and Physical  Said Rueb QAS:[id number] DOB: 25-Apr-1961 DOA: 06/21/2015  Referring physician: ED physician PCP: PROVIDER NOT IN SYSTEM  Specialists:   Chief Complaint: Chest pain, shortness of breath  HPI: Logan Mccarthy is a 54 y.o. male with PMH of hypertension, OSA on CPAP, back pain, who presents with chest pain or shortness breath.  Patient reports that he has been having shortness of breath in the past 4 days, but no cough, fever or chills. 2 days ago he started having intermittent chest pain. It is located in the substernal area, 5 out of 10 in severity, nonradiating. Chest pain is not pleuritic. Patient does not have abdominal pain, nausea, vomiting, diarrhea, symptoms of UTI, unilateral weakness. Of note, patient frequently travels from [place] to [place] by driving, lasted traveling was 3 weeks ago. He reports that he has tenderness over  left posterior thigh.   In ED, patient was found to have elevated troponin 0.13, mild ST elevation, a GI, negative chest x-ray, elevated d-dimer>20, ESR=1. This is admitted to inpatient for further evaluation and treatment. Cardiology was consulted by ED.  Where does patient live?   At home  Can patient participate in ADLs?  Yes  Review of Systems:   General: no fevers, chills, no changes in body weight, has fatigue HEENT: no blurry vision, hearing changes or sore throat Pulm: has dyspnea, no coughing, wheezing CV: has chest pain, no palpitations Abd: no nausea, vomiting, abdominal pain, diarrhea, constipation GU: no dysuria, burning on urination, increased urinary frequency, hematuria  Ext: no leg edema. Has tenderness over left posterior thigh  Neuro: no unilateral weakness, numbness, or tingling, no vision change or hearing loss Skin: no rash MSK: No muscle spasm, no deformity, no limitation of range of movement in spin Heme: No easy bruising.  Travel history: No recent long distant travel.  Allergy:  No Known Allergies  Past Medical History  Diagnosis Date  . Hypertension   . Back pain   . Sleep apnea     CPAP    Past Surgical History  Procedure Laterality Date  . Heel spurs    . Plantar fascitis    . Knee surgery Right     Social History:  reports that he has never smoked. He has never used smokeless tobacco. He reports that he does not drink alcohol or use illicit drugs.  Family History:  Family History  Problem Relation Age of Onset  . Hypertension Mother      Prior to Admission medications   Medication Sig Start Date End Date Taking? Authorizing Provider  amLODipine (NORVASC) 10 MG tablet Take 10 mg by mouth daily.   Yes Historical Provider, MD  losartan-hydrochlorothiazide (HYZAAR) 100-25 MG tablet Take 1 tablet by mouth daily.   Yes Historical Provider, MD  oxyCODONE (OXYCONTIN) 60 MG 12 hr tablet Take 60 mg by mouth every 12 (twelve) hours.   Yes Historical Provider, MD  oxycodone (ROXICODONE) 30 MG immediate release tablet Take 30 mg by mouth every 8 (eight) hours as needed for pain.   Yes Historical Provider, MD  methocarbamol (ROBAXIN) 500 MG tablet Take 1 tablet (500 mg total) by mouth 2 (two) times daily. 03/30/14   Hyman Bible, PA-C    Physical Exam: Filed Vitals:   06/21/15 2345 06/22/15 0000 06/22/15 0015 06/22/15 0030  BP: 123/107 119/75 135/81   Pulse: 84 86 88 88  Temp:      TempSrc:      Resp: 18 21 22  20  Height:      Weight:      SpO2: 95% 96% 98% 94%   General: Not in acute distress HEENT:       Eyes: PERRL, EOMI, no scleral icterus.       ENT: No discharge from the ears and nose, no pharynx injection, no tonsillar enlargement.        Neck: No JVD, no bruit, no mass felt. Heme: No neck lymph node enlargement. Cardiac: S1/S2, RRR, No murmurs, No gallops or rubs. Pulm:  No rales, wheezing, rhonchi or rubs. Abd: Soft, nondistended, nontender, no rebound pain, no organomegaly, BS present. Ext: No pitting leg edema bilaterally. 2+DP/PT  pulse bilaterally. Musculoskeletal: No joint deformities, No joint redness or warmth, no limitation of ROM in spin. Skin: No rashes.  Neuro: Alert, oriented X3, cranial nerves II-XII grossly intact, muscle strength 5/5 in all extremities, sensation to light touch intact.  Psych: Patient is not psychotic, no suicidal or hemocidal ideation.  Labs on Admission:  Basic Metabolic Panel:  Recent Labs Lab 06/21/15 1902  NA 140  K 4.6  CL 99*  CO2 26  GLUCOSE 143*  BUN 16  CREATININE 1.36*  CALCIUM 9.8   Liver Function Tests: No results for input(s): AST, ALT, ALKPHOS, BILITOT, PROT, ALBUMIN in the last 168 hours. No results for input(s): LIPASE, AMYLASE in the last 168 hours. No results for input(s): AMMONIA in the last 168 hours. CBC:  Recent Labs Lab 06/21/15 1902  WBC 10.2  HGB 14.6  HCT 42.8  MCV 88.6  PLT 125*   Cardiac Enzymes:  Recent Labs Lab 06/21/15 2100  TROPONINI 0.13*    BNP (last 3 results)  Recent Labs  06/21/15 2013  BNP 67.8    ProBNP (last 3 results) No results for input(s): PROBNP in the last 8760 hours.  CBG: No results for input(s): GLUCAP in the last 168 hours.  Radiological Exams on Admission: Dg Chest 2 View  06/21/2015  CLINICAL DATA:  Chest pain with shortness of breath.  Left leg pain. EXAM: CHEST  2 VIEW COMPARISON:  None. FINDINGS: The heart size and mediastinal contours are within normal limits. Both lungs are clear. No pleural effusion or pneumothorax. The visualized skeletal structures are unremarkable. IMPRESSION: No active cardiopulmonary disease. Electronically Signed   By: Lajean Manes M.D.   On: 06/21/2015 19:59    EKG: Independently reviewed.  QTC 435, mild ST elevation in V2-V4, LAD, poor R-wave  Assessment/Plan Principal Problem:   Chest pain Active Problems:   Elevated troponin   SOB (shortness of breath)   Essential hypertension   Back pain   Sleep apnea   AKI (acute kidney injury) ([place])  Chest pain and  SOB: Patient has an elevated troponin and mildly elevated ST segment, indicating possible ACS. Patient has hx of frequent long distant traveling, and now has tenderness over left posterior thigh, indicating possible DVT, therefore pulmonary embolism is a potential differential diagnosis for his shortness of breath. Cardiology was consulted by ED, Dr. Percival Spanish saw the patient, recommended to start Heparin and rule out PE. If no PE, pt will need  diagnostic cardiac cath.   - will admit to Tele bed  - Appreciated Dr.Hochrein consultation. Will follow up recommendations. - cycle CE q6 x3 and repeat her EKG in the am  - Nitroglycerin, Morphine, and aspirin, lipitor  - Risk factor stratification: will check FLP and A1C  - 2d echo - started Heparin gtt in ED - V/Q scan -  LE doppler to r/o DVT  HTN: -Hold Hyzaar due to AKI -Continue amlodipine -IV hydralazine when necessary  OSA: -CPAP  Chronic back pain: -Continue home OxyContin and the oxycodone -On robaxin  AKI: Likely due to prerenal secondary to dehydration and continuation of ARB and diruetics - IVF: NS 500 cc and then 100 cc/h - Check FeUrea - US-renal - Follow up renal function by BMP - Hold hyzaar  DVT ppx: on IV Heparin   Code Status: Full code Family Communication: None at bed side.      Disposition Plan: Admit to inpatient   Date of Service 06/22/2015    Ivor Costa [facility] Pager [phone]  If 7PM-7AM, please contact night-coverage [email or web address] Password [email or web address] 06/22/2015, 1:23 AM

## 2015-06-22 NOTE — Consult Note (Addendum)
Name: Logan Mccarthy MRN: [id number] DOB: [birth year]    ADMISSION DATE:  06/21/2015 CONSULTATION DATE:  11/5  REFERRING MD :  Jerral Ralph ([facility])  CHIEF COMPLAINT: submassive PE   BRIEF PATIENT DESCRIPTION: 54yo male never smoker with hx HTN, OSA presented 11/4 with chest pain, SOB.  Initially had cardiac w/u for mild ST elevation and minimally elevated troponin.  Ultimately found to have large bilat submassive PE.  PCCM consulted.   SIGNIFICANT EVENTS    STUDIES:  CTA chest 11/5>>> 1. Large volume bilateral PE including saddle embolus. Positive for acute PE with CT evidence of right heart strain (RV/LV Ratio = 1.1) consistent with at least submassive (intermediate risk) PE. BLE dopplers 11/5>>> extensive bilat DVT - RLE femoral, popliteal, post tibial, peroneal, LLE femoral, popliteal, post tibial, peroneal 2D echo 11/5>>>   HISTORY OF PRESENT ILLNESS:  54yo male with hx HTN, CPAP presented 11/4 with 4 day hx chest pain, SOB.  Travels frequently to [place] by car, most recently about 3 weeks prior to admit. Initially in ER had mild ST elevation and mildly elevated troponin.  He was started on heparin gtt and PE w/u initiated.  Had AKI initially so CTA was delayed.  BLE dopplers POS for bilat DVT and CTA chest completed 11/5 showed bilat submassive PE. PCCM consulted.   Per pt had 4-5 days chest pain, SOB intermittently.  No cough, fever, syncope, hemoptysis, orthopnea, leg/calf pain.  Does state he had more frequent "charlie horses".  Currently SOB and chest pain completely resolved.    No known family hx clots.  Sister with breast cancer.  He has not had colonoscopy, was scheduled for December.    PAST MEDICAL HISTORY :   has a past medical history of Hypertension; Back pain; and Sleep apnea.  has past surgical history that includes Heel spurs; Plantar fascitis; and Knee surgery (Right). Prior to Admission medications   Medication Sig Start Date End Date Taking? Authorizing Provider    amLODipine (NORVASC) 10 MG tablet Take 10 mg by mouth daily.   Yes Historical Provider, MD  losartan-hydrochlorothiazide (HYZAAR) 100-25 MG tablet Take 1 tablet by mouth daily.   Yes Historical Provider, MD  oxyCODONE (OXYCONTIN) 60 MG 12 hr tablet Take 60 mg by mouth every 12 (twelve) hours.   Yes Historical Provider, MD  oxycodone (ROXICODONE) 30 MG immediate release tablet Take 30 mg by mouth every 8 (eight) hours as needed for pain.   Yes Historical Provider, MD  methocarbamol (ROBAXIN) 500 MG tablet Take 1 tablet (500 mg total) by mouth 2 (two) times daily. 03/30/14   Santiago Glad, PA-C   No Known Allergies  FAMILY HISTORY:  family history includes Hypertension in his mother. SOCIAL HISTORY:  reports that he has never smoked. He has never used smokeless tobacco. He reports that he does not drink alcohol or use illicit drugs.  REVIEW OF SYSTEMS:   As per HPI - All other systems reviewed and were neg.    SUBJECTIVE:   VITAL SIGNS: Temp:  [98.3 F (36.8 C)-98.8 F (37.1 C)] 98.3 F (36.8 C) (11/05 0602) Pulse Rate:  [75-91] 75 (11/05 0602) Resp:  [16-30] 18 (11/05 0602) BP: (108-153)/(71-107) 116/78 mmHg (11/05 1013) SpO2:  [92 %-100 %] 98 % (11/05 0602) Weight:  [258 lb 11.2 oz (117.346 kg)-265 lb (120.203 kg)] 258 lb 11.2 oz (117.346 kg) (11/05 0202)  PHYSICAL EXAMINATION: General:  Very pleasant male, NAD in bed visiting with family  Neuro:  Awake, alert, MAE  HEENT:  Mm moist, no JVD  Cardiovascular:  s1s2 rrr Lungs:  resps even, non labored on RA, diminished bases otherwise cta  Abdomen:  Round, soft, non tender  Musculoskeletal:  Warm and dry, scant BLE edema, BLE symmetric, non tender   Recent Labs Lab 06/21/15 1902 06/22/15 0406  NA 140 138  K 4.6 4.2  CL 99* 108  CO2 26 20*  BUN 16 15  CREATININE 1.36* 1.09  GLUCOSE 143* 94    Recent Labs Lab 06/21/15 1902 06/22/15 0406  HGB 14.6 13.8  HCT 42.8 39.7  WBC 10.2 11.2*  PLT 125* 113*   Dg Chest  2 View  06/21/2015  CLINICAL DATA:  Chest pain with shortness of breath.  Left leg pain. EXAM: CHEST  2 VIEW COMPARISON:  None. FINDINGS: The heart size and mediastinal contours are within normal limits. Both lungs are clear. No pleural effusion or pneumothorax. The visualized skeletal structures are unremarkable. IMPRESSION: No active cardiopulmonary disease. Electronically Signed   By: Amie Portland M.D.   On: 06/21/2015 19:59   Ct Angio Chest Pe W/cm &/or Wo Cm  06/22/2015  ADDENDUM REPORT: 06/22/2015 13:15 ADDENDUM: Critical Value/emergent results were called by telephone at the time of interpretation on 06/22/2015 at 1:15 pm to Dr. Jeoffrey Massed , who verbally acknowledged these results. Electronically Signed   By: Malachy Moan M.D.   On: 06/22/2015 13:15  06/22/2015  CLINICAL DATA:  54 year old male with chest pain, exertional dyspnea and elevated D-dimer EXAM: CT ANGIOGRAPHY CHEST WITH CONTRAST TECHNIQUE: Multidetector CT imaging of the chest was performed using the standard protocol during bolus administration of intravenous contrast. Multiplanar CT image reconstructions and MIPs were obtained to evaluate the vascular anatomy. CONTRAST:  OMNIPAQUE IOHEXOL 350 MG/ML SOLN COMPARISON:  Prior chest x-ray 06/21/2015 FINDINGS: Mediastinum: Unremarkable CT appearance of the thyroid gland. No suspicious mediastinal or hilar adenopathy. No soft tissue mediastinal mass. The thoracic esophagus is unremarkable. Heart/Vascular: Adequate opacification of the pulmonary arteries to the proximal subsegmental level. There is large volume bilateral pulmonary embolus including a saddle embolus. Emboli extend into lobar, segmental and some subsegmental arteries throughout all lobes of both lungs. The RV/LV ratio is elevated at 1.1 normal caliber thoracic aorta. No pericardial effusion. Lungs/Pleura: Dependent atelectasis in both lower lobes. No focal airspace consolidation, pulmonary edema, pleural effusion or  pneumothorax. No suspicious pulmonary mass or nodule. Bones/Soft Tissues: No acute fracture or aggressive appearing lytic or blastic osseous lesion. Upper Abdomen: Visualized upper abdominal organs are unremarkable. Review of the MIP images confirms the above findings. IMPRESSION: 1. Large volume bilateral PE including saddle embolus. Positive for acute PE with CT evidence of right heart strain (RV/LV Ratio = 1.1) consistent with at least submassive (intermediate risk) PE. The presence of right heart strain has been associated with an increased risk of morbidity and mortality. Please activate Code PE by paging [phone]. 2. Dependent atelectasis bilaterally. Electronically Signed: By: Malachy Moan M.D. On: 06/22/2015 13:11   US Renal  06/22/2015  CLINICAL DATA:  Hypertension.  Pain. EXAM: RENAL / URINARY TRACT ULTRASOUND COMPLETE COMPARISON:  None. FINDINGS: Right Kidney: Length: 11.1 cm. Echogenicity within normal limits. No mass or hydronephrosis visualized. Left Kidney: Length: 13.6 cm . Echogenicity within normal limits. 3 cm lower pole cyst. No suspicious mass or hydronephrosis visualized. Bladder: Appears normal for degree of bladder distention. IMPRESSION: No significant abnormality Electronically Signed   By: Ellery Plunk M.D.   On: 06/22/2015 02:47    ASSESSMENT /  PLAN:  Submassive PE - low risk PESI score.  Hemodynamically stable, resp status stable.  Extensive BLE DVT   PLAN -  Continue heparin gtt  2D echo pending - if no sig R heart strain continue with IV heparin gtt and transition to oral anticoagulation  If R heart strain is sig, consider EKOS +/- IVC filter given significant clot burden in chest and EXTENSIVE BLE DVTs remaining  Complete cancer screenings when ok to hold anti-coagulation (in ~6 weeks) Supplemental O2 if needed   D/w Dr. Jerral RalphGhimire.   Discussed at length with pt and sig other at bedside 11/5  Dirk DressKaty Whiteheart, NP 06/22/2015  1:55 PM Pager: [phone]  [phone] or [phone]   Attending note: I have seen and examined the patient with nurse practitioner/resident and agree with the note. History, labs and imaging reviewed.  54 year old with history of hypertension, OSA presents with extensive bilateral PEs, lower extremity DVTs. sPESI score is negative indicating low risk. RV/LV ratio is 1.1 on CT scan. Troponin is mildly elevated at 0.13. EKG is not impressive and he is hemodynamically stable, on room air.  Plan: Agree with heparin and anticoagulation Follow-up echocardiogram to assess RV function Check BNP We will hold off on EKOS for now. If he should decompensate we can reconsider. Check hypercoagulable panel.  Rest of plan as below.  Chilton GreathousePraveen Shavonna Corella MD [facility] Pager [phone] If no answer or after 3pm call: [phone] 06/22/2015, 4:04 PM

## 2015-06-22 NOTE — Progress Notes (Signed)
*  Preliminary Results* Bilateral lower extremity venous duplex completed. The right lower extremity is positive for acute deep vein thrombosis involving the right femoral, popliteal, posterior tibial, and peroneal veins. The left lower extremity is positive for acute deep vein thrombosis involving the left femoral, popliteal, posterior tibial, and peroneal veins. There is no evidence of Baker's cyst bilaterally.  Preliminary results discussed with Dawn, RN.  06/22/2015  Gertie FeyMichelle Stanley Lyness, RVT, RDCS, RDMS

## 2015-06-22 NOTE — Progress Notes (Signed)
Patient refuses to have bed alarm turned on despite education and purpose explained to patient. Resp therapy made aware that patient has order for c-pap. [facility] Isaac Lac[employer]

## 2015-06-22 NOTE — Progress Notes (Signed)
  Echocardiogram 2D Echocardiogram has been performed.  Delcie RochENNINGTON, Yuvia Plant 06/22/2015, 2:10 PM

## 2015-06-22 NOTE — Progress Notes (Signed)
Pt has home CPAP at bedside and states he does not need any further assistance . 

## 2015-06-22 NOTE — Progress Notes (Signed)
ANTICOAGULATION CONSULT NOTE  Pharmacy Consult for Heparin Indication: chest pain/ACS  No Known Allergies  Patient Measurements: Height: 5\' 9"  (175.3 cm) Weight: 258 lb 11.2 oz (117.346 kg) IBW/kg (Calculated) : 70.7 Heparin Dosing Weight: 98 kg  Vital Signs: Temp: 98.3 F (36.8 C) (11/05 0602) Temp Source: Oral (11/05 0602) BP: 116/78 mmHg (11/05 1013) Pulse Rate: 75 (11/05 0602)  Labs:  Recent Labs  06/21/15 1902 06/21/15 2100 06/22/15 0406 06/22/15 1030 06/22/15 1558  HGB 14.6  --  13.8  --   --   HCT 42.8  --  39.7  --   --   PLT 125*  --  113*  --   --   APTT  --   --  113*  --   --   LABPROT  --   --  15.3*  --   --   INR  --   --  1.19  --   --   HEPARINUNFRC  --   --   --  0.47 0.37  CREATININE 1.36*  --  1.09  --   --   TROPONINI  --  0.13* 0.06* 0.04*  --     Estimated Creatinine Clearance: 97.9 mL/min (by C-G formula based on Cr of 1.09).   Medical History: Past Medical History  Diagnosis Date  . Hypertension   . Back pain   . Sleep apnea     CPAP    Medications:  Prescriptions prior to admission  Medication Sig Dispense Refill Last Dose  . amLODipine (NORVASC) 10 MG tablet Take 10 mg by mouth daily.   06/20/2015 at Unknown time  . losartan-hydrochlorothiazide (HYZAAR) 100-25 MG tablet Take 1 tablet by mouth daily.   06/20/2015 at Unknown time  . oxyCODONE (OXYCONTIN) 60 MG 12 hr tablet Take 60 mg by mouth every 12 (twelve) hours.   06/21/2015 at Unknown time  . oxycodone (ROXICODONE) 30 MG immediate release tablet Take 30 mg by mouth every 8 (eight) hours as needed for pain.   Past Week at Unknown time  . methocarbamol (ROBAXIN) 500 MG tablet Take 1 tablet (500 mg total) by mouth 2 (two) times daily. 20 tablet 0     Assessment: 54 YOM with shortness of breath and slight chest pain on presentation to the ED. Troponin mildly elevated. Pharmacy consulted to start IV heparin for ACS, now for r/o PE and bolus/rate increased. Patient had driven for 4  hours 5 days ago. V/Q scan, dopplers ordered to r/o DVT/PE. H/H wnl. Plt 113K. He was not on any anticoagulation prior to admission. Patient refusing lab draws and HL moved back several hours - may need to switch agents.  Confirmatory HL is still therapeutic at 0.37. Continue current rate and follow up am labs  Goal of Therapy:  Heparin level 0.3-0.7 units/ml Monitor platelets by anticoagulation protocol: Yes   Plan:  Continue Heparin gtt at 1,600 units/h Monitor daily HL, CBC, s/s of bleed F/u V/Q scan, dopplers  Enzo BiNathan Kyliegh Jester, PharmD Clinical Pharmacist Pager [phone] 06/22/2015 4:47 PM

## 2015-06-23 DIAGNOSIS — R7989 Other specified abnormal findings of blood chemistry: Secondary | ICD-10-CM

## 2015-06-23 DIAGNOSIS — I2699 Other pulmonary embolism without acute cor pulmonale: Secondary | ICD-10-CM | POA: Insufficient documentation

## 2015-06-23 DIAGNOSIS — I10 Essential (primary) hypertension: Secondary | ICD-10-CM

## 2015-06-23 DIAGNOSIS — M549 Dorsalgia, unspecified: Secondary | ICD-10-CM

## 2015-06-23 LAB — BASIC METABOLIC PANEL
Anion gap: 8 (ref 5–15)
BUN: 12 mg/dL (ref 6–20)
CO2: 22 mmol/L (ref 22–32)
Calcium: 7.9 mg/dL — ABNORMAL LOW (ref 8.9–10.3)
Chloride: 105 mmol/L (ref 101–111)
Creatinine, Ser: 1.06 mg/dL (ref 0.61–1.24)
GFR calc Af Amer: >60 mL/min (ref >60)
GFR calc non Af Amer: >60 mL/min (ref >60)
Glucose, Bld: 123 mg/dL — ABNORMAL HIGH (ref 65–99)
Potassium: 4.1 mmol/L (ref 3.5–5.1)
Sodium: 135 mmol/L (ref 135–145)

## 2015-06-23 LAB — CBC
HCT: 37.3 % — ABNORMAL LOW (ref 39.0–52.0)
Hemoglobin: 12.7 g/dL — ABNORMAL LOW (ref 13.0–17.0)
MCH: 30.8 pg (ref 26.0–34.0)
MCHC: 34 g/dL (ref 30.0–36.0)
MCV: 90.5 fL (ref 78.0–100.0)
Platelets: 105 10*3/uL — ABNORMAL LOW (ref 150–400)
RBC: 4.12 MIL/uL — ABNORMAL LOW (ref 4.22–5.81)
RDW: 13.6 % (ref 11.5–15.5)
WBC: 9.4 10*3/uL (ref 4.0–10.5)

## 2015-06-23 LAB — GLUCOSE, CAPILLARY: Glucose-Capillary: 106 mg/dL — ABNORMAL HIGH (ref 65–99)

## 2015-06-23 LAB — HEPARIN LEVEL (UNFRACTIONATED): Heparin Unfractionated: 0.27 IU/mL — ABNORMAL LOW (ref 0.30–0.70)

## 2015-06-23 LAB — UREA NITROGEN, URINE: Urea Nitrogen, Ur: 1222 mg/dL

## 2015-06-23 MED ORDER — RIVAROXABAN 20 MG PO TABS: 20.0000 mg | ORAL_TABLET | Freq: Every day | ORAL | Status: DC

## 2015-06-23 MED ORDER — RIVAROXABAN 15 MG PO TABS
15.0000 mg | ORAL_TABLET | Freq: Two times a day (BID) | ORAL | Status: DC
  Administered 2015-06-23 – 2015-06-24 (×3): 15 mg via ORAL
  Filled 2015-06-23 (×3): qty 1

## 2015-06-23 NOTE — Progress Notes (Signed)
PATIENT DETAILS Name: Logan Mccarthy Age: 54 y.o. Sex: male Date of Birth: [birth year] Admit Date: 06/21/2015 Admitting Physician Lorretta Harp, MD ZOX:[id number] NOT IN SYSTEM  Subjective: Feels better-no chest pain at rest. No shortness of breath at rest.  Assessment/Plan: Principal Problem: Pulmonary embolism: Presented with exertional dyspnea-further workup with a CT angiogram of chest and lower extremity Dopplers demonstrated significant pulmonary embolism and bilateral DVT. 2-D echocardiogram showed mild right ventricular strain. Seen by [facility] against pursuing catheter guided lytic treatment-recommendations are to continue with anticoagulation. Clinically improved, claims no further chest pain, significantly less dyspnea-able to ambulate to the bathroom without major problems. Since no plans to pursue catheter guided lytic treatment-we will transition to Xarelto. Note-long discussion with patient at bedside-different options of anticoagulation discussed- coumadin vs NOAC's-side effects including bleeding discussed-patient chooses NOAC-start Xarelto. Suspect DVT provoked long-distance travel  Active Problems: Minimally elevated troponin: Likely secondary to pulmonary embolism.  Supportive care.  AKI: Likely prerenal azotemia from losartan/HCTZ use-resolved with hydration.  Hypertension: Cautiously continue with amlodipine-continue to hold losartan and HCTZ.  Chronic pain syndrome/chronic back pain: Continue with narcotics. On MiraLAX for bowel regimen  OSA:in the setting of obesity-continue CPAP  Disposition: Remain inpatient-home in 1-2 days  Antimicrobial agents  See below  Anti-infectives    None      DVT Prophylaxis: IV Heparin   Code Status: Full code   Family Communication None at bedside  Procedures: None  CONSULTS:  cardiology  Time spent 30 minutes-Greater than 50% of this time was spent in counseling, explanation of diagnosis,  planning of further management, and coordination of care.  MEDICATIONS: Scheduled Meds: . amLODipine  10 mg Oral Daily  . atorvastatin  40 mg Oral q1800  . methocarbamol  500 mg Oral BID  . oxyCODONE  60 mg Oral Q12H  . polyethylene glycol  17 g Oral Daily  . sodium chloride  3 mL Intravenous Q12H   Continuous Infusions: . sodium chloride 100 mL/hr at 06/23/15 0800  . heparin 1,800 Units/hr (06/23/15 0800)   PRN Meds:.acetaminophen **OR** acetaminophen, alum & mag hydroxide-simeth, hydrALAZINE, morphine injection, nitroGLYCERIN, ondansetron **OR** ondansetron (ZOFRAN) IV, oxycodone    PHYSICAL EXAM: Vital signs in last 24 hours: Filed Vitals:   06/22/15 1013 06/22/15 2100 06/23/15 0500 06/23/15 1000  BP: 116/78 126/73 117/71 117/71  Pulse:  68 74   Temp:  98.4 F (36.9 C) 98.3 F (36.8 C)   TempSrc:      Resp:  16 22   Height:      Weight:   120.793 kg (266 lb 4.8 oz)   SpO2:  99% 92%     Weight change: 0.59 kg (1 lb 4.8 oz) Filed Weights   06/21/15 1847 06/22/15 0202 06/23/15 0500  Weight: 120.203 kg (265 lb) 117.346 kg (258 lb 11.2 oz) 120.793 kg (266 lb 4.8 oz)   Body mass index is 39.31 kg/(m^2).   Gen Exam: Awake and alert with clear speech.  Neck: Supple, No JVD.   Chest: B/L Clear.   CVS: S1 S2 Regular, no murmurs.  Abdomen: soft, BS +, non tender, non distended.  Extremities: no edema, lower extremities warm to touch Neurologic: Non Focal.   Skin: No Rash.   Wounds: N/A.    Intake/Output from previous day:  Intake/Output Summary (Last 24 hours) at 06/23/15 1013 Last data filed at 06/23/15 0800  Gross per 24 hour  Intake 2961.33 ml  Output   2000 ml  Net 961.33 ml     LAB RESULTS: CBC  Recent Labs Lab 06/21/15 1902 06/22/15 0406 06/23/15 0101  WBC 10.2 11.2* 9.4  HGB 14.6 13.8 12.7*  HCT 42.8 39.7 37.3*  PLT 125* 113* 105*  MCV 88.6 88.4 90.5  MCH 30.2 30.7 30.8  MCHC 34.1 34.8 34.0  RDW 13.4 13.5 13.6    Chemistries   Recent  Labs Lab 06/21/15 1902 06/22/15 0406 06/23/15 0101  NA 140 138 135  K 4.6 4.2 4.1  CL 99* 108 105  CO2 26 20* 22  GLUCOSE 143* 94 123*  BUN CREATININE 1.36* 1.09 1.06  CALCIUM 9.8 8.2* 7.9*    CBG:  Recent Labs Lab 06/22/15 0736 06/23/15 0731  GLUCAP 115* 106*    GFR Estimated Creatinine Clearance: 102.2 mL/min (by C-G formula based on Cr of 1.06).  Coagulation profile  Recent Labs Lab 06/22/15 0406  INR 1.19    Cardiac Enzymes  Recent Labs Lab 06/22/15 0406 06/22/15 1030 06/22/15 1558  TROPONINI 0.06* 0.04* 0.04*    Invalid input(s): POCBNP  Recent Labs  06/21/15 2100  DDIMER >20.00*   No results for input(s): HGBA1C in the last 72 hours.  Recent Labs  06/22/15 0407  CHOL 168  HDL 45  LDLCALC 108*  TRIG 77  CHOLHDL 3.7   No results for input(s): TSH, T4TOTAL, T3FREE, THYROIDAB in the last 72 hours.  Invalid input(s): FREET3 No results for input(s): VITAMINB12, FOLATE, FERRITIN, TIBC, IRON, RETICCTPCT in the last 72 hours. No results for input(s): LIPASE, AMYLASE in the last 72 hours.  Urine Studies No results for input(s): UHGB, CRYS in the last 72 hours.  Invalid input(s): UACOL, UAPR, USPG, UPH, UTP, UGL, UKET, UBIL, UNIT, UROB, ULEU, UEPI, UWBC, URBC, UBAC, CAST, UCOM, BILUA  MICROBIOLOGY: No results found for this or any previous visit (from the past 240 hour(s)).  RADIOLOGY STUDIES/RESULTS: Dg Chest 2 View  06/21/2015  CLINICAL DATA:  Chest pain with shortness of breath.  Left leg pain. EXAM: CHEST  2 VIEW COMPARISON:  None. FINDINGS: The heart size and mediastinal contours are within normal limits. Both lungs are clear. No pleural effusion or pneumothorax. The visualized skeletal structures are unremarkable. IMPRESSION: No active cardiopulmonary disease. Electronically Signed   By: Amie Portland M.D.   On: 06/21/2015 19:59   Ct Angio Chest Pe W/cm &/or Wo Cm  06/22/2015  ADDENDUM REPORT: 06/22/2015 13:15 ADDENDUM:  Critical Value/emergent results were called by telephone at the time of interpretation on 06/22/2015 at 1:15 pm to Dr. Jeoffrey Massed , who verbally acknowledged these results. Electronically Signed   By: Malachy Moan M.D.   On: 06/22/2015 13:15  06/22/2015  CLINICAL DATA:  54 year old male with chest pain, exertional dyspnea and elevated D-dimer EXAM: CT ANGIOGRAPHY CHEST WITH CONTRAST TECHNIQUE: Multidetector CT imaging of the chest was performed using the standard protocol during bolus administration of intravenous contrast. Multiplanar CT image reconstructions and MIPs were obtained to evaluate the vascular anatomy. CONTRAST:  OMNIPAQUE IOHEXOL 350 MG/ML SOLN COMPARISON:  Prior chest x-ray 06/21/2015 FINDINGS: Mediastinum: Unremarkable CT appearance of the thyroid gland. No suspicious mediastinal or hilar adenopathy. No soft tissue mediastinal mass. The thoracic esophagus is unremarkable. Heart/Vascular: Adequate opacification of the pulmonary arteries to the proximal subsegmental level. There is large volume bilateral pulmonary embolus including a saddle embolus. Emboli extend into lobar, segmental and some subsegmental arteries throughout all lobes of both lungs. The RV/LV  ratio is elevated at 1.1 normal caliber thoracic aorta. No pericardial effusion. Lungs/Pleura: Dependent atelectasis in both lower lobes. No focal airspace consolidation, pulmonary edema, pleural effusion or pneumothorax. No suspicious pulmonary mass or nodule. Bones/Soft Tissues: No acute fracture or aggressive appearing lytic or blastic osseous lesion. Upper Abdomen: Visualized upper abdominal organs are unremarkable. Review of the MIP images confirms the above findings. IMPRESSION: 1. Large volume bilateral PE including saddle embolus. Positive for acute PE with CT evidence of right heart strain (RV/LV Ratio = 1.1) consistent with at least submassive (intermediate risk) PE. The presence of right heart strain has been  associated with an increased risk of morbidity and mortality. Please activate Code PE by paging [phone]. 2. Dependent atelectasis bilaterally. Electronically Signed: By: Malachy MoanHeath  McCullough M.D. On: 06/22/2015 13:11   [place] Renal  06/22/2015  CLINICAL DATA:  Hypertension.  Pain. EXAM: RENAL / URINARY TRACT ULTRASOUND COMPLETE COMPARISON:  None. FINDINGS: Right Kidney: Length: 11.1 cm. Echogenicity within normal limits. No mass or hydronephrosis visualized. Left Kidney: Length: 13.6 cm . Echogenicity within normal limits. 3 cm lower pole cyst. No suspicious mass or hydronephrosis visualized. Bladder: Appears normal for degree of bladder distention. IMPRESSION: No significant abnormality Electronically Signed   By: Ellery Plunkaniel R Mitchell M.D.   On: 06/22/2015 02:47    Jeoffrey MassedGHIMIRE,SHANKER, MD  [facility] Pager:[phone]  If 7PM-7AM, please contact night-coverage [email or web address] Password [email or web address] 06/23/2015, 10:13 AM   LOS: 1 day

## 2015-06-23 NOTE — Progress Notes (Signed)
Pt refused bed alarm at hs.  Pt verbalized understanding that he is on strict bedrest and verbally agrees to not get out of the bed due to risk.  Shift nursing report given to [facility] Moose, Charity fundraiserN.

## 2015-06-23 NOTE — Progress Notes (Signed)
Patient advised that his 928 year old daughter was not allowed to stay in the room overnight with him unless another adult also stayed in order to take care of the child. Patient stated that there was no other family or friends available to take the child home. Patient stated that 2 previous RNs during the day had given the 'ok' for his daughter to stay and that he had also told the MD that he needed to discharge on 11/6 for the same reason. Emergency contact information obtained for the child: close family member - Concepcion LivingJessica Prieto at [phone].

## 2015-06-23 NOTE — Progress Notes (Signed)
ANTICOAGULATION CONSULT NOTE  Pharmacy Consult for Heparin Indication: PE  No Known Allergies  Patient Measurements: Height: 5\' 9"  (175.3 cm) Weight: 258 lb 11.2 oz (117.346 kg) IBW/kg (Calculated) : 70.7 Heparin Dosing Weight: 98 kg  Vital Signs: Temp: 98.4 F (36.9 C) (11/05 2100) BP: 126/73 mmHg (11/05 2100) Pulse Rate: 68 (11/05 2100)  Labs:  Recent Labs  06/21/15 1902  06/22/15 0406 06/22/15 1030 06/22/15 1558 06/23/15 0101  HGB 14.6  --  13.8  --   --  12.7*  HCT 42.8  --  39.7  --   --  37.3*  PLT 125*  --  113*  --   --  105*  APTT  --   --  113*  --   --   --   LABPROT  --   --  15.3*  --   --   --   INR  --   --  1.19  --   --   --   HEPARINUNFRC  --   --   --  0.47 0.37 0.27*  CREATININE 1.36*  --  1.09  --   --   --   TROPONINI  --   < > 0.06* 0.04* 0.04*  --   < > = values in this interval not displayed.  Estimated Creatinine Clearance: 97.9 mL/min (by C-G formula based on Cr of 1.09).  Assessment: 54 YOM on heparin for Large volume bilateral PE including saddle embolus with R heart strain. Heparin level down to 0.27 (subtherapeutic) - would like at upper end of therapeutic with large PE. Plt down to 105 (125 at baseline). No issues with line or bleeding reported per RN.  Goal of Therapy:  Heparin level 0.3-0.7 units/ml Monitor platelets by anticoagulation protocol: Yes   Plan:  Increase heparin gtt to 1800 units/h F/u 6 hr heparin level  Enzo BiNathan Batchelder, PharmD Clinical Pharmacist Pager [phone] 06/23/2015 3:10 AM

## 2015-06-23 NOTE — Progress Notes (Signed)
Pt has home CPAP at bedside and states he does not need any further assistance . 

## 2015-06-23 NOTE — Discharge Instructions (Addendum)
Please follow with your primary care practitioner-and resume  other blood pressure pills at his/her discretion.  Follow with Primary MD and a Hematologist of your choice  as instructed your Hospitalist MD  You have been started on anticoagulation-if you develop black stools, bloody stools, bloody vomiting, unexplained severe headache, persistent dizziness, unilateral weakness, or get involved in a motor vehicle accident/trauma please seek immediate medical attention  Please get a complete blood count and chemistry panel checked by your Primary MD at your next visit, and again as instructed by your Primary MD.  Get Medicines reviewed and adjusted. Please take all your medications with you for your next visit with your Primary MD  Please request your Primary MD to go over all hospital tests and procedure/radiological results at the follow up, please ask your Primary MD to get all Hospital records sent to his/her office.  If you experience worsening of your admission symptoms, develop shortness of breath, life threatening emergency, suicidal or homicidal thoughts you must seek medical attention immediately by calling 911 or calling your MD immediately  if symptoms less severe.  You must read complete instructions/literature along with all the possible adverse reactions/side effects for all the Medicines you take and that have been prescribed to you. Take any new Medicines after you have completely understood and accpet all the possible adverse reactions/side effects.   Do not drive when taking Pain medications or sleeping medications (Benzodaizepines)  Do not take more than prescribed Pain, Sleep and Anxiety Medications  Special Instructions: If you have smoked or chewed Tobacco  in the last 2 yrs please stop smoking, stop any regular Alcohol  and or any Recreational drug use.  Wear Seat belts while driving.  Please note  You were cared for by a hospitalist during your hospital stay. Once you  are discharged, your primary care physician will handle any further medical issues. Please note that NO REFILLS for any discharge medications will be authorized once you are discharged, as it is imperative that you return to your primary care physician (or establish a relationship with a primary care physician if you do not have one) for your aftercare needs so that they can reassess your need for medications and monitor your lab values.      Information on my medicine - XARELTO (rivaroxaban)  WHY WAS XARELTO PRESCRIBED FOR YOU? Xarelto was prescribed to treat blood clots that may have been found in the veins of your legs (deep vein thrombosis) or in your lungs (pulmonary embolism) and to reduce the risk of them occurring again.  What do you need to know about Xarelto? The starting dose is one 15 mg tablet taken TWICE daily with food for the FIRST 21 DAYS then on 07/14/15  the dose is changed to one 20 mg tablet taken ONCE A DAY with your evening meal.  DO NOT stop taking Xarelto without talking to the health care provider who prescribed the medication.  Refill your prescription for 20 mg tablets before you run out.  After discharge, you should have regular check-up appointments with your healthcare provider that is prescribing your Xarelto.  In the future your dose may need to be changed if your kidney function changes by a significant amount.  What do you do if you miss a dose? If you are taking Xarelto TWICE DAILY and you miss a dose, take it as soon as you remember. You may take two 15 mg tablets (total 30 mg) at the same time then resume your  regularly scheduled 15 mg twice daily the next day.  If you are taking Xarelto ONCE DAILY and you miss a dose, take it as soon as you remember on the same day then continue your regularly scheduled once daily regimen the next day. Do not take two doses of Xarelto at the same time.   Important Safety Information Xarelto is a blood thinner  medicine that can cause bleeding. You should call your healthcare provider right away if you experience any of the following: ? Bleeding from an injury or your nose that does not stop. ? Unusual colored urine (red or dark brown) or unusual colored stools (red or black). ? Unusual bruising for unknown reasons. ? A serious fall or if you hit your head (even if there is no bleeding).  Some medicines may interact with Xarelto and might increase your risk of bleeding while on Xarelto. To help avoid this, consult your healthcare provider or pharmacist prior to using any new prescription or non-prescription medications, including herbals, vitamins, non-steroidal anti-inflammatory drugs (NSAIDs) and supplements.  This website has more information on Xarelto: VisitDestination.com.br.

## 2015-06-23 NOTE — Progress Notes (Signed)
ANTICOAGULATION CONSULT NOTE - Follow Up Consult  Pharmacy Consult for rivaroxaban Indication: pulmonary embolus  No Known Allergies  Patient Measurements: Height: 5\' 9"  (175.3 cm) Weight: 266 lb 4.8 oz (120.793 kg) IBW/kg (Calculated) : 70.7  Vital Signs: Temp: 98.3 F (36.8 C) (11/06 0500) BP: 117/71 mmHg (11/06 1000) Pulse Rate: 74 (11/06 0500)  Labs:  Recent Labs  06/21/15 1902  06/22/15 0406 06/22/15 1030 06/22/15 1558 06/23/15 0101  HGB 14.6  --  13.8  --   --  12.7*  HCT 42.8  --  39.7  --   --  37.3*  PLT 125*  --  113*  --   --  105*  APTT  --   --  113*  --   --   --   LABPROT  --   --  15.3*  --   --   --   INR  --   --  1.19  --   --   --   HEPARINUNFRC  --   --   --  0.47 0.37 0.27*  CREATININE 1.36*  --  1.09  --   --  1.06  TROPONINI  --   < > 0.06* 0.04* 0.04*  --   < > = values in this interval not displayed.  Estimated Creatinine Clearance: 102.2 mL/min (by C-G formula based on Cr of 1.06).   Assessment: [age] yoM admitted 06/21/2015 with large volume bilateral PE including saddle embolus with R heart strain. Patient to transition to Xarelto for anticoagulation. Hgb 12.7, Platelets down to 105 (125 at baseline). No s/sx of bleeding noted. CrCl >100 mL/min  Goal of Therapy: Monitor platelets by anticoagulation protocol: Yes   Plan:  - Start Xarelto 15 mg BID for 21 days, then Xarelto 20 mg daily - Stop heparin drip with first dose of Xarelto - Monitor CBC, s/sx of bleeding  Casilda Carlsaylor Elmus Mathes, PharmD. Clinical Pharmacist Resident Pager: [phone] 06/23/2015,10:47 AM

## 2015-06-23 NOTE — Progress Notes (Signed)
Name: Logan Mccarthy MRN: [id number] DOB: 08/02/1961    ADMISSION DATE:  06/21/2015 CONSULTATION DATE:  11/5  REFERRING MD :  Ghimire ([facility])  CHIEF COMPLAINT: submassive PE   BRIEF PATIENT DESCRIPTION: 54yo male never smoker with hx HTN, OSA presented 11/4 with chest pain, SOB.  Initially had cardiac w/u for mild ST elevation and minimally elevated troponin.  Ultimately found to have large bilat submassive PE.  PCCM consulted.   SIGNIFICANT EVENTS    STUDIES:  CTA chest 11/5>>> 1. Large volume bilateral PE including saddle embolus. Positive for acute PE with CT evidence of right heart strain (RV/LV Ratio = 1.1) consistent with at least submassive (intermediate risk) PE. BLE dopplers 11/5>>> extensive bilat DVT - RLE femoral, popliteal, post tibial, peroneal, LLE femoral, popliteal, post tibial, peroneal 2D echo 11/5>>>EF 55-60%,  Grade 1 diastolic dysfunction, mild/mod RV dilation, mod dilated LA   SUBJECTIVE:  No change overnight. Denies SOB, chest pian, leg pain.    VITAL SIGNS: Temp:  [98.3 F (36.8 C)-98.4 F (36.9 C)] 98.3 F (36.8 C) (11/06 0500) Pulse Rate:  [68-74] 74 (11/06 0500) Resp:  [16-22] 22 (11/06 0500) BP: (117-126)/(71-73) 117/71 mmHg (11/06 1000) SpO2:  [92 %-99 %] 92 % (11/06 0500) Weight:  [266 lb 4.8 oz (120.793 kg)] 266 lb 4.8 oz (120.793 kg) (11/06 0500)  PHYSICAL EXAMINATION: General:  Very pleasant male, NAD in bed having labs drawn   Neuro:  Awake, alert, MAE  HEENT:  Mm moist, no JVD  Cardiovascular:  s1s2 rrr Lungs:  resps even, non labored on RA, diminished bases otherwise cta  Abdomen:  Round, soft, non tender  Musculoskeletal:  Warm and dry, scant BLE edema, BLE symmetric, non tender   Recent Labs Lab 06/21/15 1902 06/22/15 0406 06/23/15 0101  NA 140 138 135  K 4.6 4.2 4.1  CL 99* 108 105  CO2 26 20* 22  BUN 16 15 12   CREATININE 1.36* 1.09 1.06  GLUCOSE 143* 94 123*    Recent Labs Lab 06/21/15 1902 06/22/15 0406  06/23/15 0101  HGB 14.6 13.8 12.7*  HCT 42.8 39.7 37.3*  WBC 10.2 11.2* 9.4  PLT 125* 113* 105*   Dg Chest 2 View  06/21/2015  CLINICAL DATA:  Chest pain with shortness of breath.  Left leg pain. EXAM: CHEST  2 VIEW COMPARISON:  None. FINDINGS: The heart size and mediastinal contours are within normal limits. Both lungs are clear. No pleural effusion or pneumothorax. The visualized skeletal structures are unremarkable. IMPRESSION: No active cardiopulmonary disease. Electronically Signed   By: David  Ormond M.D.   On: 06/21/2015 19:59   Ct Angio Chest Pe W/cm &/or Wo Cm  06/22/2015  ADDENDUM REPORT: 06/22/2015 13:15 ADDENDUM: Critical Value/emergent results were called by telephone at the time of interpretation on 06/22/2015 at 1:15 pm to Dr. SHANKER GHIMIRE , who verbally acknowledged these [phone]. Electronically Signed   By: Heath  [phone] loMarland KitchenAlb Unremarkable CT appearance of the thyroid gland. No suspicious mediastinal or hilar adenopathy. No soft tissue mediastinal mass. The thoracic esophagus is unremarkable.  Heart/Vascular: Adequate opacification of the pulmonary arteries to the proximal subsegmental level. There is large volume bilateral pulmonary embolus including a saddle embolus. Emboli extend into lobar, segmental and some subsegmental arteries throughout all lobes of both lungs. The RV/LV ratio is elevated at 1.1 normal caliber thoracic aorta. No pericardial effusion. Lungs/Pleura:  Dependent atelectasis in both lower lobes. No focal airspace consolidation, pulmonary edema, pleural effusion or pneumothorax. No suspicious pulmonary mass or nodule. Bones/Soft Tissues: No acute fracture or aggressive appearing lytic or blastic osseous lesion. Upper Abdomen: Visualized upper abdominal organs are unremarkable. Review of the MIP images confirms the above findings. IMPRESSION: 1. Large volume bilateral PE including saddle embolus. Positive for acute PE with CT evidence of right heart strain (RV/LV Ratio = 1.1) consistent with at least submassive (intermediate risk) PE. The presence of right heart strain has been associated with an increased risk of morbidity and mortality. Please activate Code PE by paging [phone]. 2. Dependent atelectasis bilaterally. Electronically Signed: By: Malachy Moan M.D. On: 06/22/2015 13:11   US Renal  06/22/2015  CLINICAL DATA:  Hypertension.  Pain. EXAM: RENAL / URINARY TRACT ULTRASOUND COMPLETE COMPARISON:  None. FINDINGS: Right Kidney: Length: 11.1 cm. Echogenicity within normal limits. No mass or hydronephrosis visualized. Left Kidney: Length: 13.6 cm . Echogenicity within normal limits. 3 cm lower pole cyst. No suspicious mass or hydronephrosis visualized. Bladder: Appears normal for degree of bladder distention. IMPRESSION: No significant abnormality Electronically Signed   By: Ellery Plunk M.D.   On: 06/22/2015 02:47    ASSESSMENT / PLAN:  Submassive PE - low risk sPESI score.  Hemodynamically stable, resp status stable.  Extensive BLE DVT   PLAN -  Continue heparin gtt with transition to xarelto per pharmacy  No acute indication EKOS or systemic lytics.  Complete cancer screenings when ok to hold anti-coagulation (in ~6 weeks) Likely d/c home in next 24 hours per primady  Will need f/u echo as outpt   PCCM singing off please call back if needed.   Dirk Dress, NP 06/23/2015  11:00 AM Pager: [phone]

## 2015-06-24 DIAGNOSIS — G4730 Sleep apnea, unspecified: Secondary | ICD-10-CM

## 2015-06-24 LAB — HEMOGLOBIN A1C
Hgb A1c MFr Bld: 6.6 % — ABNORMAL HIGH (ref 4.8–5.6)
Mean Plasma Glucose: 143 mg/dL

## 2015-06-24 LAB — GLUCOSE, CAPILLARY: Glucose-Capillary: 146 mg/dL — ABNORMAL HIGH (ref 65–99)

## 2015-06-24 MED ORDER — RIVAROXABAN 15 MG PO TABS: 15.0000 mg | ORAL_TABLET | Freq: Two times a day (BID) | ORAL | Status: DC

## 2015-06-24 MED ORDER — RIVAROXABAN 20 MG PO TABS: 20.0000 mg | ORAL_TABLET | Freq: Every day | ORAL | Status: DC

## 2015-06-24 NOTE — Discharge Summary (Signed)
PATIENT DETAILS Name: Logan Mccarthy Age: 54 y.o. Sex: male Date of Birth: 12/27/1960 MRN: [id number]. Admitting Physician: Lorretta Harp, MD [id number] NOT IN SYSTEM  Admit Date: 06/21/2015 Discharge date: 06/24/2015  Recommendations for Outpatient Follow-up:  1. Suspected provoked DVT/pulmonary evaluation-started on Xarelto 2. Consider outpatient hematology evaluation prior to discontinuing anticoagulation  3. Hypercoagulable workup prior to discontinuing anticoagulation  4. Age appropriate and recommended cancer screening  5.  HCTZ/losartan on hold-resume when able   PRIMARY DISCHARGE DIAGNOSIS:  Principal Problem:   Chest pain Active Problems:   Elevated troponin   SOB (shortness of breath)   Essential hypertension   Back pain   Sleep apnea   AKI (acute kidney injury) (HCC)   Pulmonary embolism without acute cor pulmonale (HCC)      PAST MEDICAL HISTORY: Past Medical History  Diagnosis Date  . Hypertension   . Back pain   . Sleep apnea     CPAP    DISCHARGE MEDICATIONS: Current Discharge Medication List    START taking these medications   Details  !! Rivaroxaban (XARELTO) 15 MG TABS tablet Take 1 tablet (15 mg total) by mouth 2 (two) times daily. LAST DOSE AT 11/26 Qty: 39 tablet, Refills: 0    !! rivaroxaban (XARELTO) 20 MG TABS tablet Take 1 tablet (20 mg total) by mouth daily with supper. Start on 07/14/15 Qty: 60 tablet, Refills: 0     !! - Potential duplicate medications found. Please discuss with provider.    CONTINUE these medications which have NOT CHANGED   Details  amLODipine (NORVASC) 10 MG tablet Take 10 mg by mouth daily.    oxyCODONE (OXYCONTIN) 60 MG 12 hr tablet Take 60 mg by mouth every 12 (twelve) hours.    oxycodone (ROXICODONE) 30 MG immediate release tablet Take 30 mg by mouth every 8 (eight) hours as needed for pain.    methocarbamol (ROBAXIN) 500 MG tablet Take 1 tablet (500 mg total) by mouth 2 (two) times daily. Qty: 20 tablet,  Refills: 0      STOP taking these medications     losartan-hydrochlorothiazide (HYZAAR) 100-25 MG tablet         ALLERGIES:  No Known Allergies  BRIEF HPI:  See H&P, Labs, Consult and Test reports for all details in brief, patient was admitted for evaluation of chest pain and exertional dyspnea, further workup demonstrated acute pulmonary embolism.   CONSULTATIONS:   cardiology and pulmonary/intensive care  PERTINENT RADIOLOGIC STUDIES: Dg Chest 2 View  06/21/2015  CLINICAL DATA:  Chest pain with shortness of breath.  Left leg pain. EXAM: CHEST  2 VIEW COMPARISON:  None. FINDINGS: The heart size and mediastinal contours are within normal limits. Both lungs are clear. No pleural effusion or pneumothorax. The visualized skeletal structures are unremarkable. IMPRESSION: No active cardiopulmonary disease. Electronically Signed   By: Amie Portland M.D.   On: 06/21/2015 19:59   Ct Angio Chest Pe W/cm &/or Wo Cm  06/22/2015  ADDENDUM REPORT: 06/22/2015 13:15 ADDENDUM: Critical Value/emergent results were called by telephone at the time of interpretation on 06/22/2015 at 1:15 pm to Dr. Jeoffrey Massed , who verbally acknowledged these results. Electronically Signed   By: Malachy Moan M.D.   On: 06/22/2015 13:15  06/22/2015  CLINICAL DATA:  54 year old male with chest pain, exertional dyspnea and elevated D-dimer EXAM: CT ANGIOGRAPHY CHEST WITH CONTRAST TECHNIQUE: Multidetector CT imaging of the chest was performed using the standard protocol during bolus administration of intravenous contrast. Multiplanar CT image  reconstructions and MIPs were obtained to evaluate the vascular anatomy. CONTRAST:  OMNIPAQUE IOHEXOL 350 MG/ML SOLN COMPARISON:  Prior chest x-ray 06/21/2015 FINDINGS: Mediastinum: Unremarkable CT appearance of the thyroid gland. No suspicious mediastinal or hilar adenopathy. No soft tissue mediastinal mass. The thoracic esophagus is unremarkable. Heart/Vascular: Adequate  opacification of the pulmonary arteries to the proximal subsegmental level. There is large volume bilateral pulmonary embolus including a saddle embolus. Emboli extend into lobar, segmental and some subsegmental arteries throughout all lobes of both lungs. The RV/LV ratio is elevated at 1.1 normal caliber thoracic aorta. No pericardial effusion. Lungs/Pleura: Dependent atelectasis in both lower lobes. No focal airspace consolidation, pulmonary edema, pleural effusion or pneumothorax. No suspicious pulmonary mass or nodule. Bones/Soft Tissues: No acute fracture or aggressive appearing lytic or blastic osseous lesion. Upper Abdomen: Visualized upper abdominal organs are unremarkable. Review of the MIP images confirms the above findings. IMPRESSION: 1. Large volume bilateral PE including saddle embolus. Positive for acute PE with CT evidence of right heart strain (RV/LV Ratio = 1.1) consistent with at least submassive (intermediate risk) PE. The presence of right heart strain has been associated with an increased risk of morbidity and mortality. Please activate Code PE by paging [phone]. 2. Dependent atelectasis bilaterally. Electronically Signed: By: Malachy Moan M.D. On: 06/22/2015 13:11   US Renal  06/22/2015  CLINICAL DATA:  Hypertension.  Pain. EXAM: RENAL / URINARY TRACT ULTRASOUND COMPLETE COMPARISON:  None. FINDINGS: Right Kidney: Length: 11.1 cm. Echogenicity within normal limits. No mass or hydronephrosis visualized. Left Kidney: Length: 13.6 cm . Echogenicity within normal limits. 3 cm lower pole cyst. No suspicious mass or hydronephrosis visualized. Bladder: Appears normal for degree of bladder distention. IMPRESSION: No significant abnormality Electronically Signed   By: Ellery Plunk M.D.   On: 06/22/2015 02:47     PERTINENT LAB RESULTS: CBC:  Recent Labs  06/22/15 0406 06/23/15 0101  WBC 11.2* 9.4  HGB 13.8 12.7*  HCT 39.7 37.3*  PLT 113* 105*   CMET CMP     Component  Value Date/Time   NA 135 06/23/2015 0101   K 4.1 06/23/2015 0101   CL 105 06/23/2015 0101   CO2 22 06/23/2015 0101   GLUCOSE 123* 06/23/2015 0101   BUN 12 06/23/2015 0101   CREATININE 1.06 06/23/2015 0101   CALCIUM 7.9* 06/23/2015 0101   PROT 6.1* 06/22/2015 0406   ALBUMIN 3.3* 06/22/2015 0406   AST 18 06/22/2015 0406   ALT 23 06/22/2015 0406   ALKPHOS 55 06/22/2015 0406   BILITOT 0.8 06/22/2015 0406   GFRNONAA >60 06/23/2015 0101   GFRAA >60 06/23/2015 0101    GFR Estimated Creatinine Clearance: 102.1 mL/min (by C-G formula based on Cr of 1.06). No results for input(s): LIPASE, AMYLASE in the last 72 hours.  Recent Labs  06/22/15 0406 06/22/15 1030 06/22/15 1558  TROPONINI 0.06* 0.04* 0.04*   Invalid input(s): POCBNP  Recent Labs  06/21/15 2100  DDIMER >20.00*   No results for input(s): HGBA1C in the last 72 hours.  Recent Labs  06/22/15 0407  CHOL 168  HDL 45  LDLCALC 108*  TRIG 77  CHOLHDL 3.7   No results for input(s): TSH, T4TOTAL, T3FREE, THYROIDAB in the last 72 hours.  Invalid input(s): FREET3 No results for input(s): VITAMINB12, FOLATE, FERRITIN, TIBC, IRON, RETICCTPCT in the last 72 hours. Coags:  Recent Labs  06/22/15 0406  INR 1.19   Microbiology: No results found for this or any previous visit (from the past 240  hour(s)).   BRIEF HOSPITAL COURSE:  Pulmonary embolism with bilateral DVT: Presented with exertional dyspnea-further workup with a CT angiogram of chest and lower extremity Dopplers demonstrated significant pulmonary embolism and bilateral DVT. 2-D echocardiogram showed mild right ventricular strain. Seen by [facility] against pursuing catheter guided lytic treatment-recommendations were to continue with anticoagulation with IV heparin. Subsequently transitioned to Xarelto on 11/6-patient claims that he has improved significantly, and has ambulated in the room and hallway without any recurrence of chest pain or shortness of  breath. Note-long discussion with patient at bedside-different options of anticoagulation discussed- coumadin vs NOAC's-side effects including bleeding discussed-patient chooses NOAC-start Xarelto. Suspect DVT provoked long-distance travel.have instructed patient to get a referral from his primary care practitioner to see a hematologist of his choice before discontinuing anticoagulation. Patient was also instructed to ask his primary care practitioner to do age-appropriate cancer screening as recommended and also to pursue a hypercoagulable workup.( Note-patient lives in [place]-he claims he will come to medical records and get all his records before he leaves [place] take to his primary care practitioner)  Minimally elevated troponin: Likely secondary to pulmonary embolism. Supportive care.  AKI: Likely prerenal azotemia from losartan/HCTZ use-resolved with hydration.  Hypertension: Cautiously continue with amlodipine-continue to hold losartan and HCTZ-as BP appears to be controlled with just amlodipine. Resume other antihypertensives when able   Chronic pain syndrome/chronic back pain: Continue with narcotics.   OSA:in the setting of obesity-continue CPAP   TODAY-DAY OF DISCHARGE:  Subjective:   Logan Mccarthy today has no headache,no chest abdominal pain,no new weakness tingling or numbness, feels much better wants to go home today.   Objective:   Blood pressure 134/82, pulse 69, temperature 98.4 F (36.9 C), temperature source Oral, resp. rate 18, height  (1.753 m), weight 120.475 kg (265 lb 9.6 oz), SpO2 99 %.  Intake/Output Summary (Last 24 hours) at 06/24/15 1040 Last data filed at 06/24/15 0800  Gross per 24 hour  Intake    720 ml  Output   1725 ml  Net  -1005 ml   Filed Weights   06/22/15 0202 06/23/15 0500 06/24/15 0500  Weight: 117.346 kg (258 lb 11.2 oz) 120.793 kg (266 lb 4.8 oz) 120.475 kg (265 lb 9.6 oz)    Exam Awake Alert, Oriented *3, No new F.N deficits,  Normal affect [place].AT,PERRAL Supple Neck,No JVD, No cervical lymphadenopathy appriciated.  Symmetrical Chest wall movement, Good air movement bilaterally, CTAB RRR,No Gallops,Rubs or new Murmurs, No Parasternal Heave +ve B.Sounds, Abd Soft, Non tender, No organomegaly appriciated, No rebound -guarding or rigidity. No Cyanosis, Clubbing or edema, No new Rash or bruise  DISCHARGE CONDITION: Stable  DISPOSITION: Home  DISCHARGE INSTRUCTIONS:    Activity:  As tolerated with Full fall precautions use [place]/cane & assistance as needed  Get Medicines reviewed and adjusted: Please take all your medications with you for your next visit with your Primary MD  Please request your Primary MD to go over all hospital tests and procedure/radiological results at the follow up, please ask your Primary MD to get all Hospital records sent to his/her office.  If you experience worsening of your admission symptoms, develop shortness of breath, life threatening emergency, suicidal or homicidal thoughts you must seek medical attention immediately by calling 911 or calling your MD immediately  if symptoms less severe.  You must read complete instructions/literature along with all the possible adverse reactions/side effects for all the Medicines you take and that have been prescribed to you. Take any  new Medicines after you have completely understood and accpet all the possible adverse reactions/side effects.   Do not drive when taking Pain medications.   Do not take more than prescribed Pain, Sleep and Anxiety Medications  Special Instructions: If you have smoked or chewed Tobacco  in the last 2 yrs please stop smoking, stop any regular Alcohol  and or any Recreational drug use.  Wear Seat belts while driving.  Please note  You were cared for by a hospitalist during your hospital stay. Once you are discharged, your primary care physician will handle any further medical issues. Please note that NO REFILLS  for any discharge medications will be authorized once you are discharged, as it is imperative that you return to your primary care physician (or establish a relationship with a primary care physician if you do not have one) for your aftercare needs so that they can reassess your need for medications and monitor your lab values.  Diet recommendation: Heart Healthy diet  Discharge Instructions    Call MD for:  difficulty breathing, headache or visual disturbances    Complete by:  As directed      Call MD for:  persistant dizziness or light-headedness    Complete by:  As directed      Call MD for:  severe uncontrolled pain    Complete by:  As directed      Diet - low sodium heart healthy    Complete by:  As directed      Discharge instructions    Complete by:  As directed   Please follow with your primary care practitioner-and resume  other blood pressure pills at his/her discretion.  Follow with Primary MD  and A Hematologist of your choice  as instructed your Hospitalist MD  You have been started on anticoagulation-if you develop black stools, bloody stools, bloody vomiting, unexplained severe headache, persistent dizziness, unilateral weakness, or get involved in a motor vehicle accident/trauma please seek immediate medical attention  Please get a complete blood count and chemistry panel checked by your Primary MD at your next visit, and again as instructed by your Primary MD.  Get Medicines reviewed and adjusted. Please take all your medications with you for your next visit with your Primary MD  Please request your Primary MD to go over all hospital tests and procedure/radiological results at the follow up, please ask your Primary MD to get all Hospital records sent to his/her office.  If you experience worsening of your admission symptoms, develop shortness of breath, life threatening emergency, suicidal or homicidal thoughts you must seek medical attention immediately by calling 911 or  calling your MD immediately  if symptoms less severe.  You must read complete instructions/literature along with all the possible adverse reactions/side effects for all the Medicines you take and that have been prescribed to you. Take any new Medicines after you have completely understood and accpet all the possible adverse reactions/side effects.   Do not drive when taking Pain medications or sleeping medications (Benzodaizepines)  Do not take more than prescribed Pain, Sleep and Anxiety Medications  Special Instructions: If you have smoked or chewed Tobacco  in the last 2 yrs please stop smoking, stop any regular Alcohol  and or any Recreational drug use.  Wear Seat belts while driving.  Please note  You were cared for by a hospitalist during your hospital stay. Once you are discharged, your primary care physician will handle any further medical issues. Please note that NO REFILLS  for any discharge medications will be authorized once you are discharged, as it is imperative that you return to your primary care physician (or establish a relationship with a primary care physician if you do not have one) for your aftercare needs so that they can reassess your need for medications and monitor your lab values.     Increase activity slowly    Complete by:  As directed            Follow-up Information    Schedule an appointment as soon as possible for a visit in 2 weeks to follow up.   Why:  Hospital follow up   Contact information:   Primary MD in [place]      Schedule an appointment as soon as possible for a visit in 2 months to follow up.   Why:  Hospital follow up-please see a Hematologist before you discontinue Anticoagulation   Contact information:   Hematologist of your choice      Total Time spent on discharge equals 45 minutes.  SignedJeoffrey Massed: Logan Mccarthy 06/24/2015 10:40 AM

## 2015-06-24 NOTE — Care Management Note (Signed)
Case Management Note  Patient Details  Name: Rosezella RumpfDwayne Kable MRN: [id number] Date of Birth: 12/17/1960  Subjective/Objective:  Pulmonary embolism with bilateral DVT                  Action/Plan: NCM spoke to pt and states he is from [place] and has Medicaid. [facility] [place] to gets meds when he is here in [place]. States his copay was $3.00. Explained to pt he can use the Xarelto 30 day free trial card at local pharmacy. Pt provided insurance cards and faxed info to admissions.   Expected Discharge Date:  06/24/15               Expected Discharge Plan:  Home/Self Care  In-House Referral:     Discharge planning Services  CM Consult, Medication Assistance  Status of Service:  Completed, signed off  Medicare Important Message Given:    Date Medicare IM Given:    Medicare IM give by:    Date Additional Medicare IM Given:    Additional Medicare Important Message give by:     If discussed at Long Length of Stay Meetings, dates discussed:    Additional Comments:  Elliot CousinShavis, Oria Klimas Ellen, RN 06/24/2015, 11:17 AM

## 2015-12-14 IMAGING — US US RENAL
1 series · 14 of 25 positions shown · non-contrast
Comparison: None.

CLINICAL DATA: Hypertension.  Pain.

EXAM:
RENAL / URINARY TRACT ULTRASOUND COMPLETE

[Series 1: us renal · 0.30mm/px · 14 of 36 slices shown]
[im 1/36]
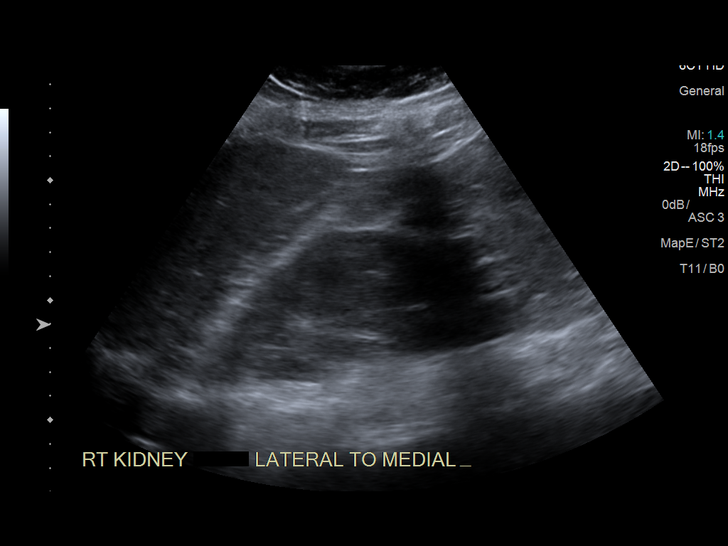
[im 3/36]
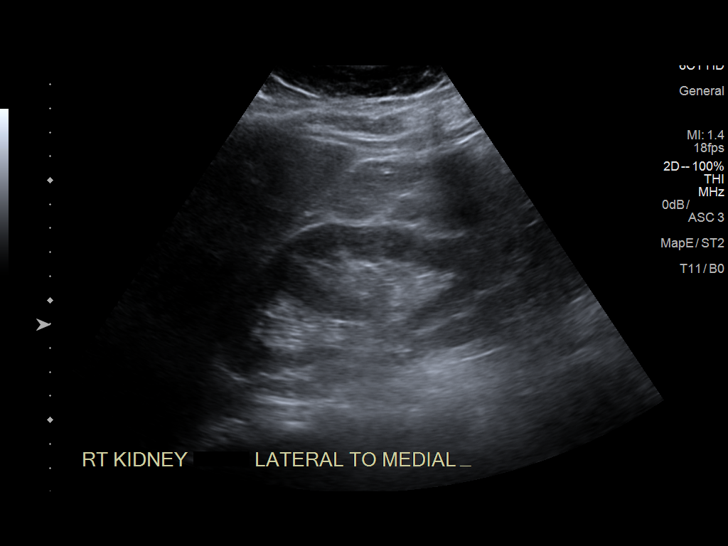
[im 6/36]
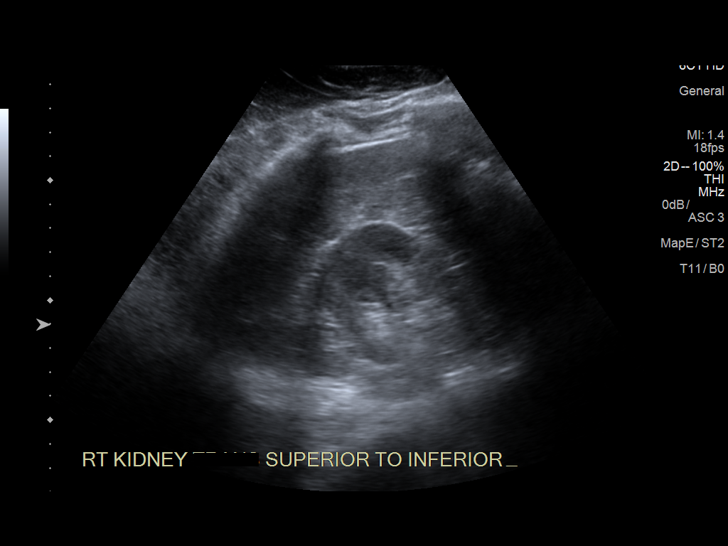
[im 9/36]
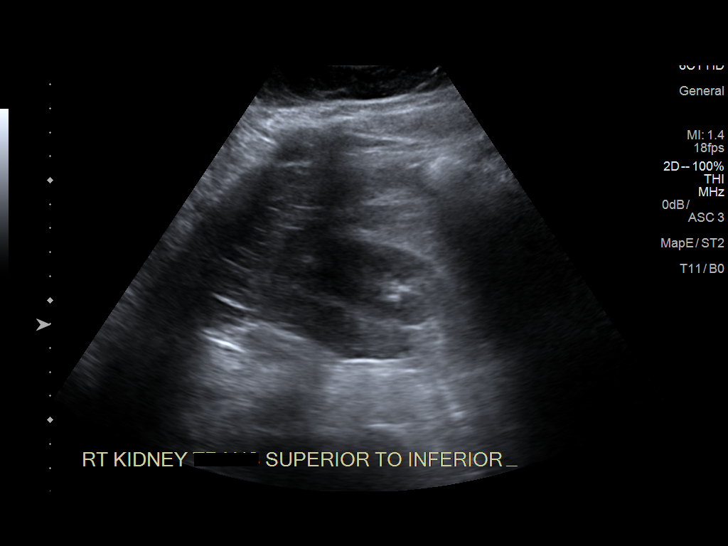
[im 12/36]
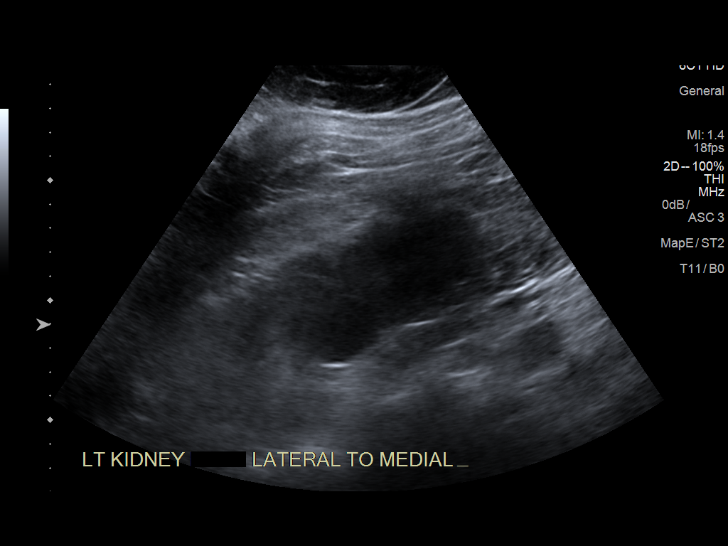
[im 14/36]
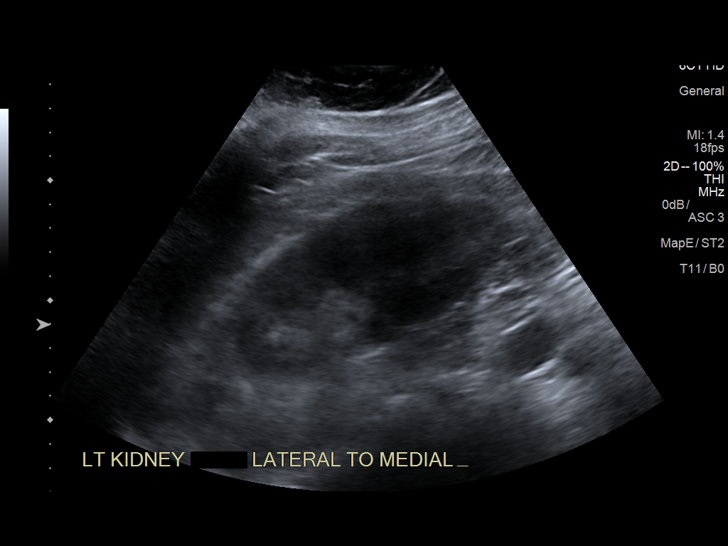
[im 17/36]
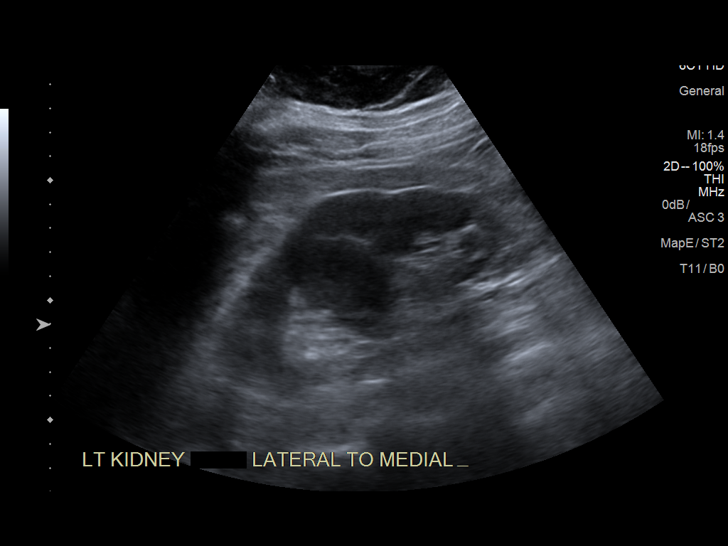
[im 19/36]
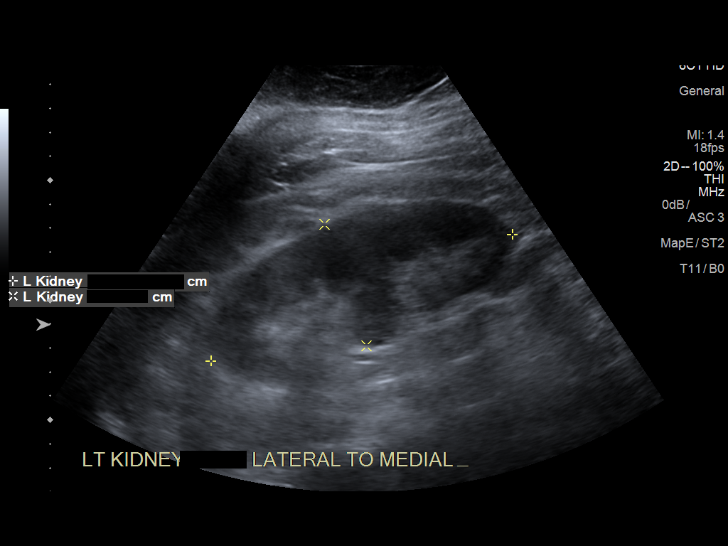
[im 22/36]
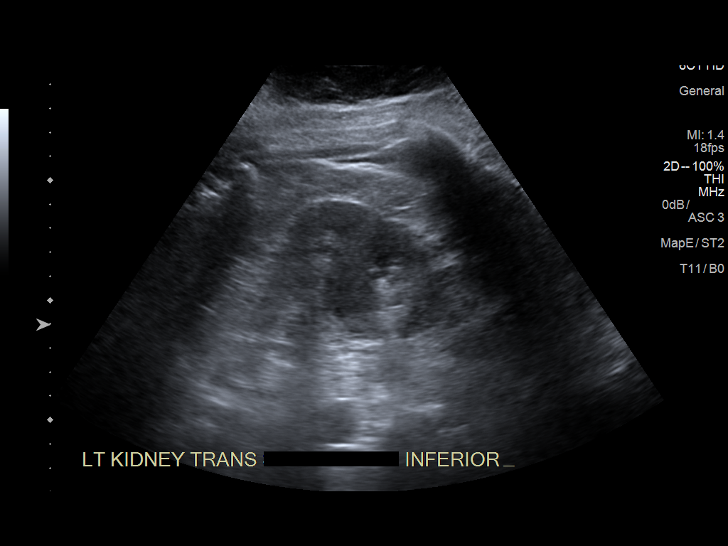
[im 24/36]
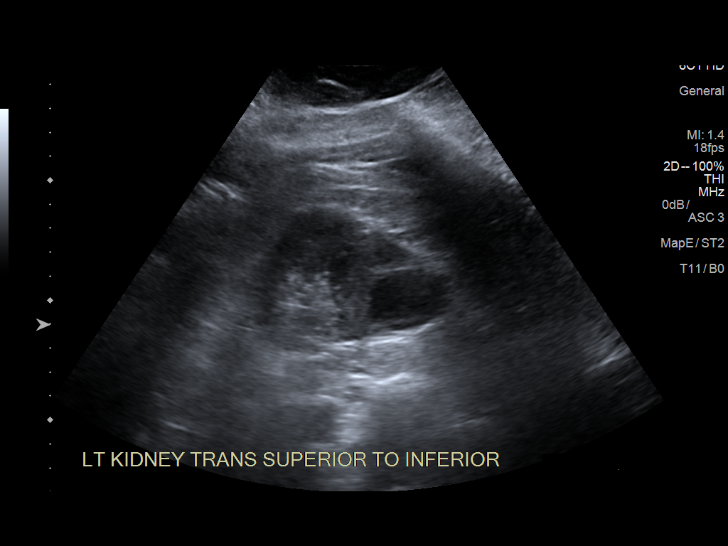
[im 27/36]
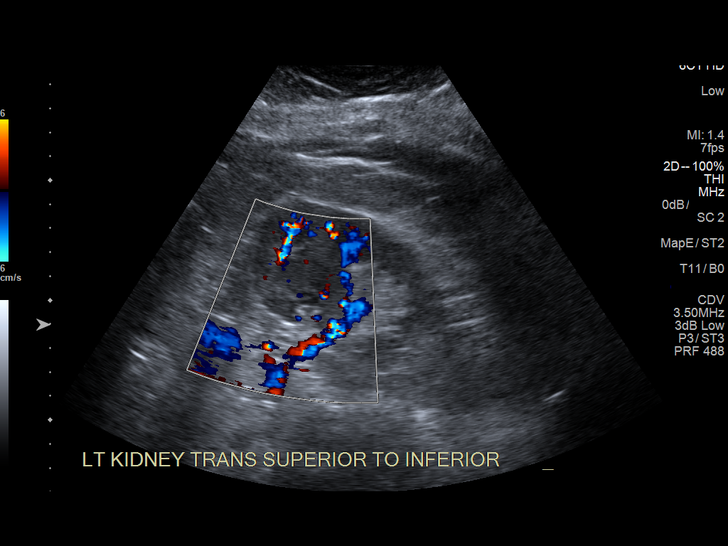
[im 30/36]
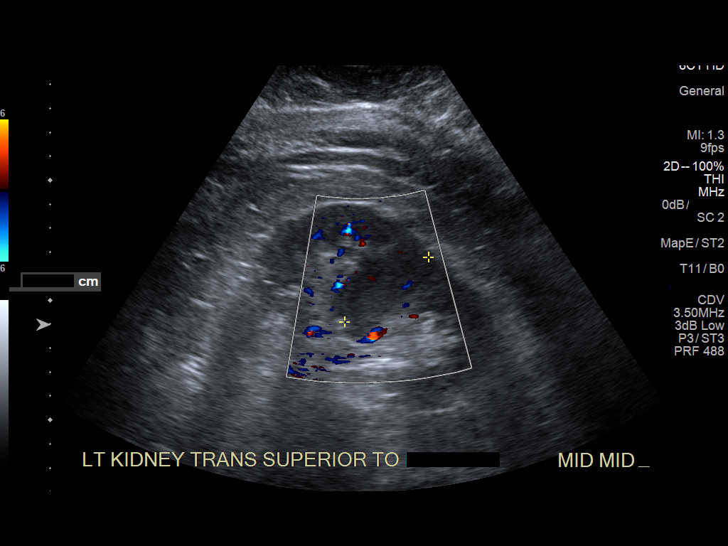
[im 33/36]
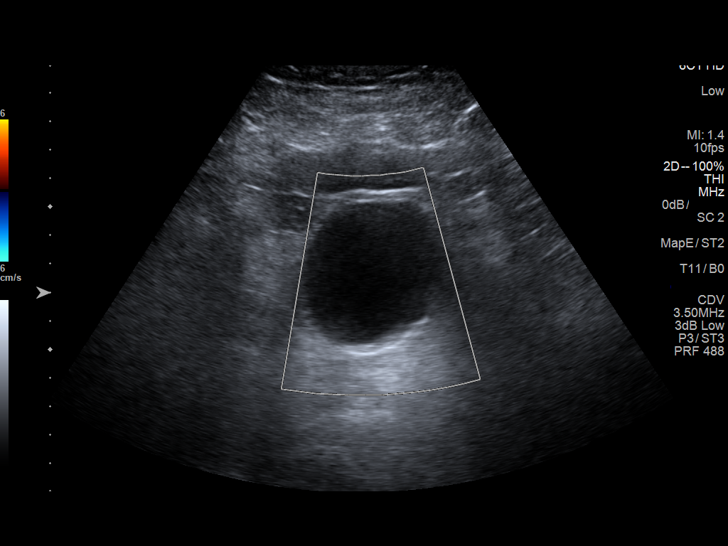
[im 36/36]
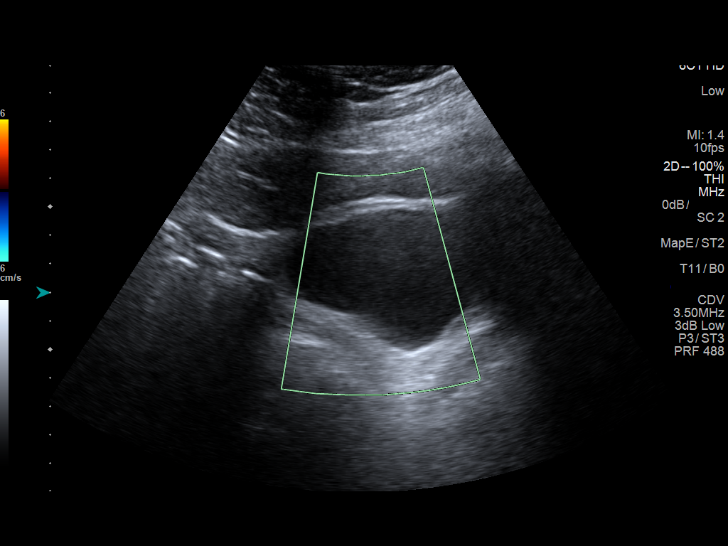

[14 of 25 positions shown; findings below may reference images not displayed]

FINDINGS: Right Kidney:

Length: 11.1 cm. Echogenicity within normal limits. No mass or
hydronephrosis visualized.

Left Kidney:

Length: 13.6 cm . Echogenicity within normal limits. 3 cm lower pole
cyst. No suspicious mass or hydronephrosis visualized.

Bladder:

Appears normal for degree of bladder distention.
IMPRESSION: No significant abnormality

## 2016-04-26 ENCOUNTER — Emergency Department (HOSPITAL_COMMUNITY)
Admission: EM | Admit: 2016-04-26 | Discharge: 2016-04-26 | Disposition: A | Discharge status: Home / Self Care | Payer: Medicaid - Out of State | Attending: Emergency Medicine | Admitting: Emergency Medicine

## 2016-04-26 ENCOUNTER — Encounter (HOSPITAL_COMMUNITY): Payer: Self-pay | Admitting: Vascular Surgery

## 2016-04-26 DIAGNOSIS — H66001 Acute suppurative otitis media without spontaneous rupture of ear drum, right ear: Secondary | ICD-10-CM | POA: Insufficient documentation

## 2016-04-26 DIAGNOSIS — Z7901 Long term (current) use of anticoagulants: Secondary | ICD-10-CM | POA: Diagnosis not present

## 2016-04-26 DIAGNOSIS — I10 Essential (primary) hypertension: Secondary | ICD-10-CM | POA: Diagnosis not present

## 2016-04-26 DIAGNOSIS — H9201 Otalgia, right ear: Secondary | ICD-10-CM | POA: Diagnosis present

## 2016-04-26 MED ORDER — ACETAMINOPHEN 500 MG PO TABS
1000.0000 mg | ORAL_TABLET | Freq: Once | ORAL | Status: AC
  Administered 2016-04-26: 1000 mg via ORAL
  Filled 2016-04-26: qty 2

## 2016-04-26 MED ORDER — AMOXICILLIN 500 MG PO CAPS: 1000.0000 mg | ORAL_CAPSULE | Freq: Two times a day (BID) | ORAL | 0 refills | Status: DC

## 2016-04-26 MED ORDER — AMOXICILLIN 500 MG PO CAPS
1000.0000 mg | ORAL_CAPSULE | Freq: Once | ORAL | Status: AC
  Administered 2016-04-26: 1000 mg via ORAL
  Filled 2016-04-26: qty 2

## 2016-04-26 NOTE — Discharge Instructions (Signed)
°  Take your antibiotics as directed and to completion. You should never have any leftover antibiotics! Push fluids and stay well hydrated.   Do not hesitate to return to the emergency room for any new, worsening or concerning symptoms.  Please obtain primary care using resource guide below. Let them know that you were seen in the emergency room and that they will need to obtain records for further outpatient management.

## 2016-04-26 NOTE — ED Triage Notes (Signed)
Pt reports to the ED for eval of right earache since this am. Pt reports he had similar symptoms in the past and he had an ear infection. Has tried OTC ear drops without relief. Denies any fevers, chills, or N/V.

## 2016-04-26 NOTE — ED Provider Notes (Signed)
[facility]-EMERGENCY DEPT Provider Note   CSN: [id number] Arrival date & time: 04/26/16  1936  By signing my name below, I, Logan Mccarthy, attest that this documentation has been prepared under the direction and in the presence of  [employer] Zalea Pete, PA-C. Electronically Signed: Clovis PuAvnee Mccarthy, ED Scribe. 04/26/16. 9:09 PM.    History   Chief Complaint Chief Complaint  Patient presents with  . Otalgia    The history is provided by the patient. No language interpreter was used.    HPI Comments:  Logan Mccarthy is a 55 y.o. male who presents to the Emergency Department complaining of constant, moderate right ear pain which began in the morning today. Pt describes the pain as being "throbbing". He states he has an associated mild cough.  Pt states he has used over the counter ear drops with little relief. Pt has had sick contact. He denies fevers, chills, nausea, vomiting, chest pain or SOB. Patient has no other physical complaints at this time.   Past Medical History:  Diagnosis Date  . Back pain   . Hypertension   . Sleep apnea    CPAP    Patient Active Problem List   Diagnosis Date Noted  . Pulmonary embolism without acute cor pulmonale (HCC)   . Elevated troponin 06/22/2015  . Chest pain 06/22/2015  . SOB (shortness of breath) 06/22/2015  . AKI (acute kidney injury) (HCC) 06/22/2015  . Essential hypertension   . Back pain   . Sleep apnea     Past Surgical History:  Procedure Laterality Date  . Heel spurs    . KNEE SURGERY Right   . Plantar fascitis         Home Medications    Prior to Admission medications   Medication Sig Start Date End Date Taking? Authorizing Provider  amLODipine (NORVASC) 10 MG tablet Take 10 mg by mouth daily.    Historical Provider, MD  methocarbamol (ROBAXIN) 500 MG tablet Take 1 tablet (500 mg total) by mouth 2 (two) times daily. 03/30/14   Heather Laisure, PA-C  oxyCODONE (OXYCONTIN) 60 MG 12 hr tablet Take 60 mg by mouth every 12 (twelve)  hours.    Historical Provider, MD  oxycodone (ROXICODONE) 30 MG immediate release tablet Take 30 mg by mouth every 8 (eight) hours as needed for pain.    Historical Provider, MD  Rivaroxaban (XARELTO) 15 MG TABS tablet Take 1 tablet (15 mg total) by mouth 2 (two) times daily. LAST DOSE AT 11/26 06/24/15   Shanker Levora DredgeM Ghimire, MD  rivaroxaban (XARELTO) 20 MG TABS tablet Take 1 tablet (20 mg total) by mouth daily with supper. Start on 07/14/15 07/14/15   Shanker Levora DredgeM Ghimire, MD    Family History Family History  Problem Relation Age of Onset  . Hypertension Mother     Social History Social History  Substance Use Topics  . Smoking status: Never Smoker  . Smokeless tobacco: Never Used  . Alcohol use No     Allergies   Review of patient's allergies indicates no known allergies.   Review of Systems Review of Systems 10 systems reviewed and all are negative for acute change except as noted in the HPI.    Physical Exam Updated Vital Signs BP 138/85 (BP Location: Left Arm)   Pulse 75   Temp 99.1 F (37.3 C) (Oral)   Resp 16   SpO2 99%   Physical Exam  Constitutional: He appears well-developed and well-nourished.  HENT:  Head: Normocephalic.  Right Ear: External  ear normal.  Left Ear: External ear normal.  Mouth/Throat: Oropharynx is clear and moist. No oropharyngeal exudate.  No drooling or stridor. Posterior pharynx mildly erythematous no significant tonsillar hypertrophy. No exudate. Soft palate rises symmetrically. No TTP or induration under tongue.   No tenderness to palpation of frontal or bilateral maxillary sinuses.  Mild mucosal edema in the nares with scant rhinorrhea.  Left tympanic membrane with normal architecture and good light reflex.  Right TM is erythematous and bulging    Eyes: Conjunctivae and EOM are normal. Pupils are equal, round, and reactive to light.  Neck: Normal range of motion. Neck supple.  Cardiovascular: Normal rate and regular rhythm.     Pulmonary/Chest: Effort normal and breath sounds normal. No stridor. No respiratory distress. He has no wheezes. He has no rales. He exhibits no tenderness.  Abdominal: Soft. There is no tenderness. There is no rebound and no guarding.  Nursing note and vitals reviewed.    ED Treatments / Results  DIAGNOSTIC STUDIES:  Oxygen Saturation is 99% on RA, normal by my interpretation.    COORDINATION OF CARE:  9:04 PM Discussed treatment plan with pt at bedside and pt agreed to plan.  Labs (all labs ordered are listed, but only abnormal results are displayed) Labs Reviewed - No data to display  EKG  EKG Interpretation None       Radiology No results found.  Procedures Procedures (including critical care time)  Medications Ordered in ED Medications - No data to display   Initial Impression / Assessment and Plan / ED Course  I have reviewed the triage vital signs and the nursing notes.  Pertinent labs & imaging results that were available during my care of the patient were reviewed by me and considered in my medical decision making (see chart for details).  Clinical Course    Vitals:   04/26/16 1949 04/26/16 2111  BP: 138/85 137/76  Pulse: 75 81  Resp: 16 20  Temp: 99.1 F (37.3 C) 99.3 F (37.4 C)  TempSrc: Oral Oral  SpO2: 99% 100%    Medications  amoxicillin (AMOXIL) capsule 1,000 mg (not administered)  acetaminophen (TYLENOL) tablet 1,000 mg (not administered)    Logan Mccarthy is 55 y.o. male presenting with Ear pain and cough with rhinorrhea, patient is afebrile, nontoxic appearing, left TM is bulging and erythematous consistent with an otitis media.  Evaluation does not show pathology that would require ongoing emergent intervention or inpatient treatment. Pt is hemodynamically stable and mentating appropriately. Discussed findings and plan with patient/guardian, who agrees with care plan. All questions answered. Return precautions discussed and  outpatient follow up given.      Final Clinical Impressions(s) / ED Diagnoses   Final diagnoses:  None    New Prescriptions New Prescriptions   No medications on file   I personally performed the services described in this documentation, which was scribed in my presence. The recorded information has been reviewed and is accurate.     Wynetta Emery, PA-C 04/26/16 2116    Cathren Laine, MD 04/26/16 [phone]

## 2017-09-05 IMAGING — DX DG CHEST 2V
2 series · 2 of 2 positions shown · non-contrast
Comparison: None.

CLINICAL DATA: Chest pain with shortness of breath.  Left leg pain.

EXAM:
CHEST  2 VIEW

[chest pa]
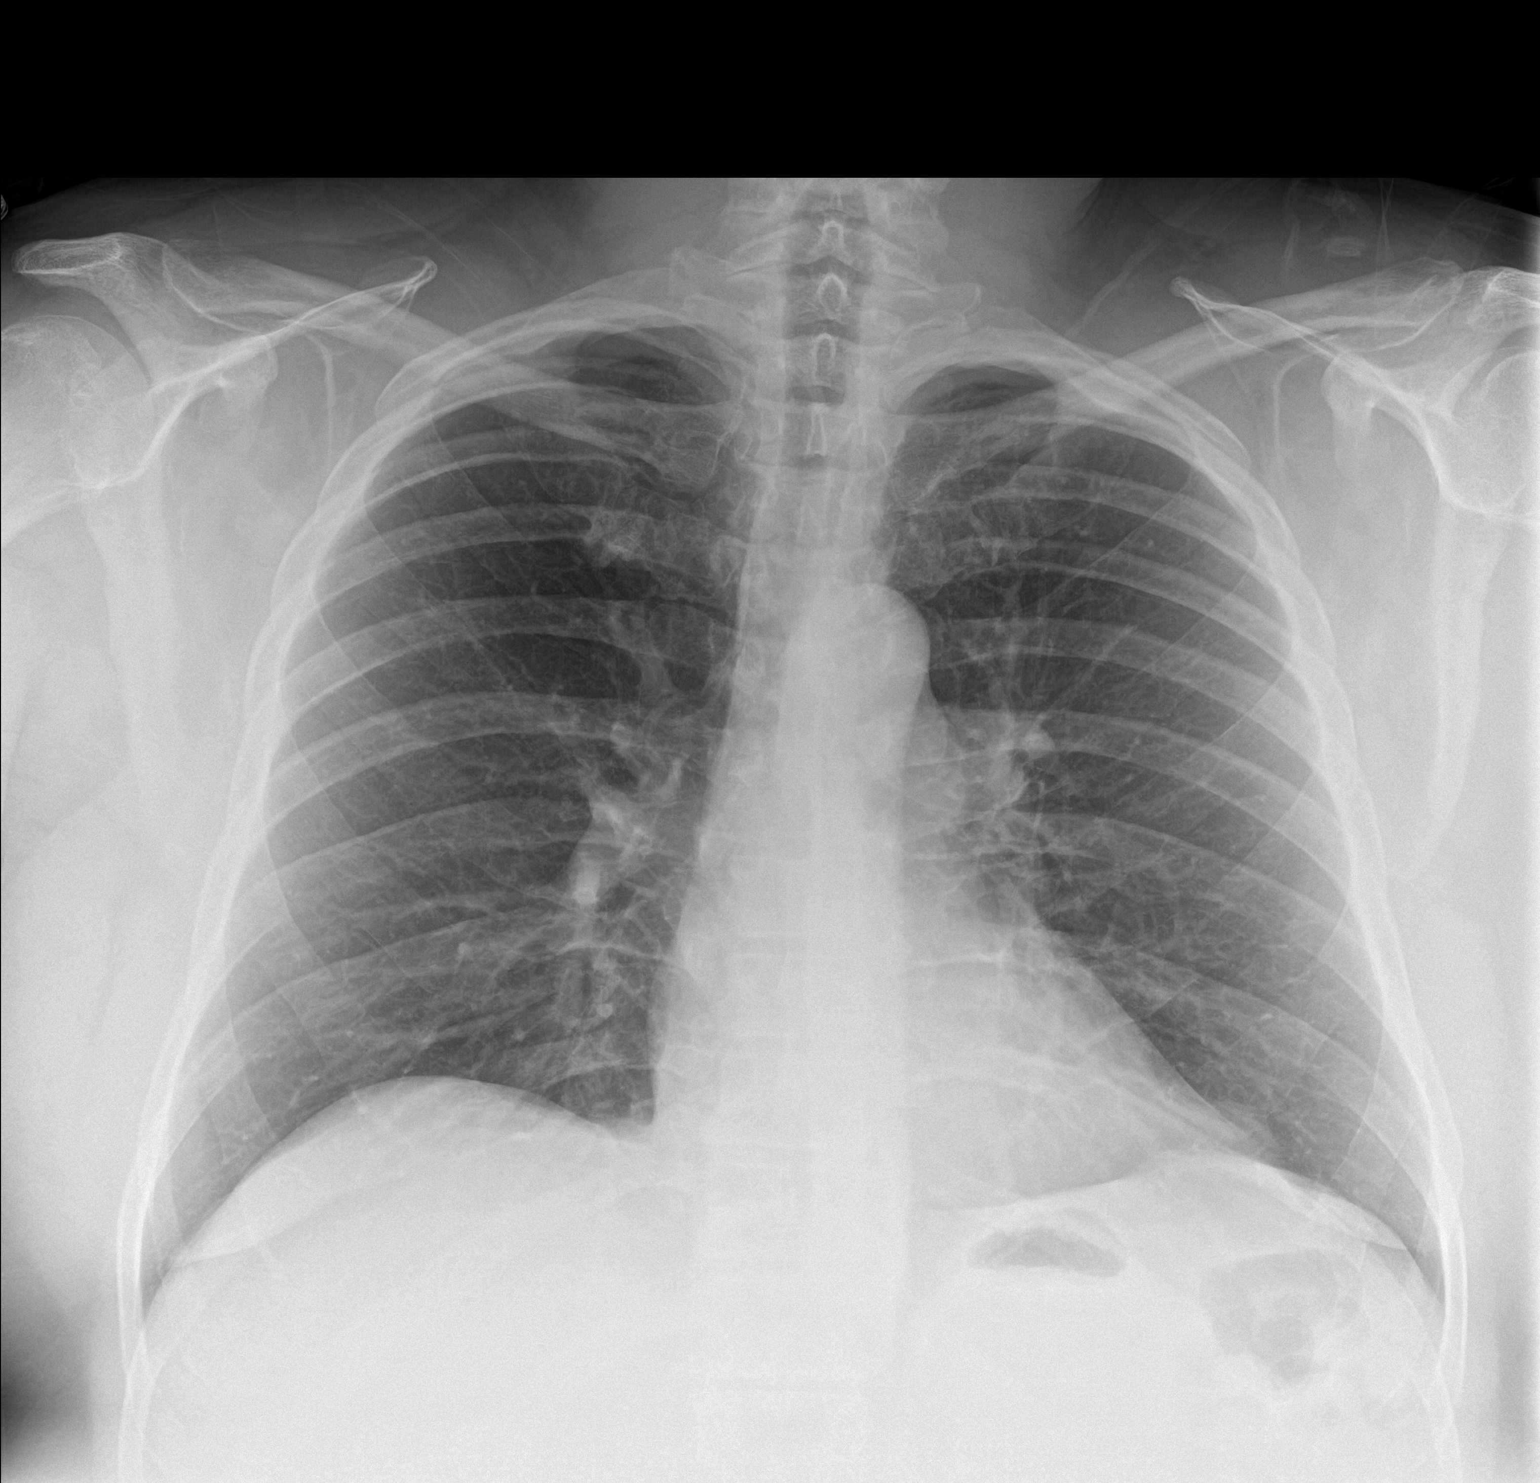

[chest lat]
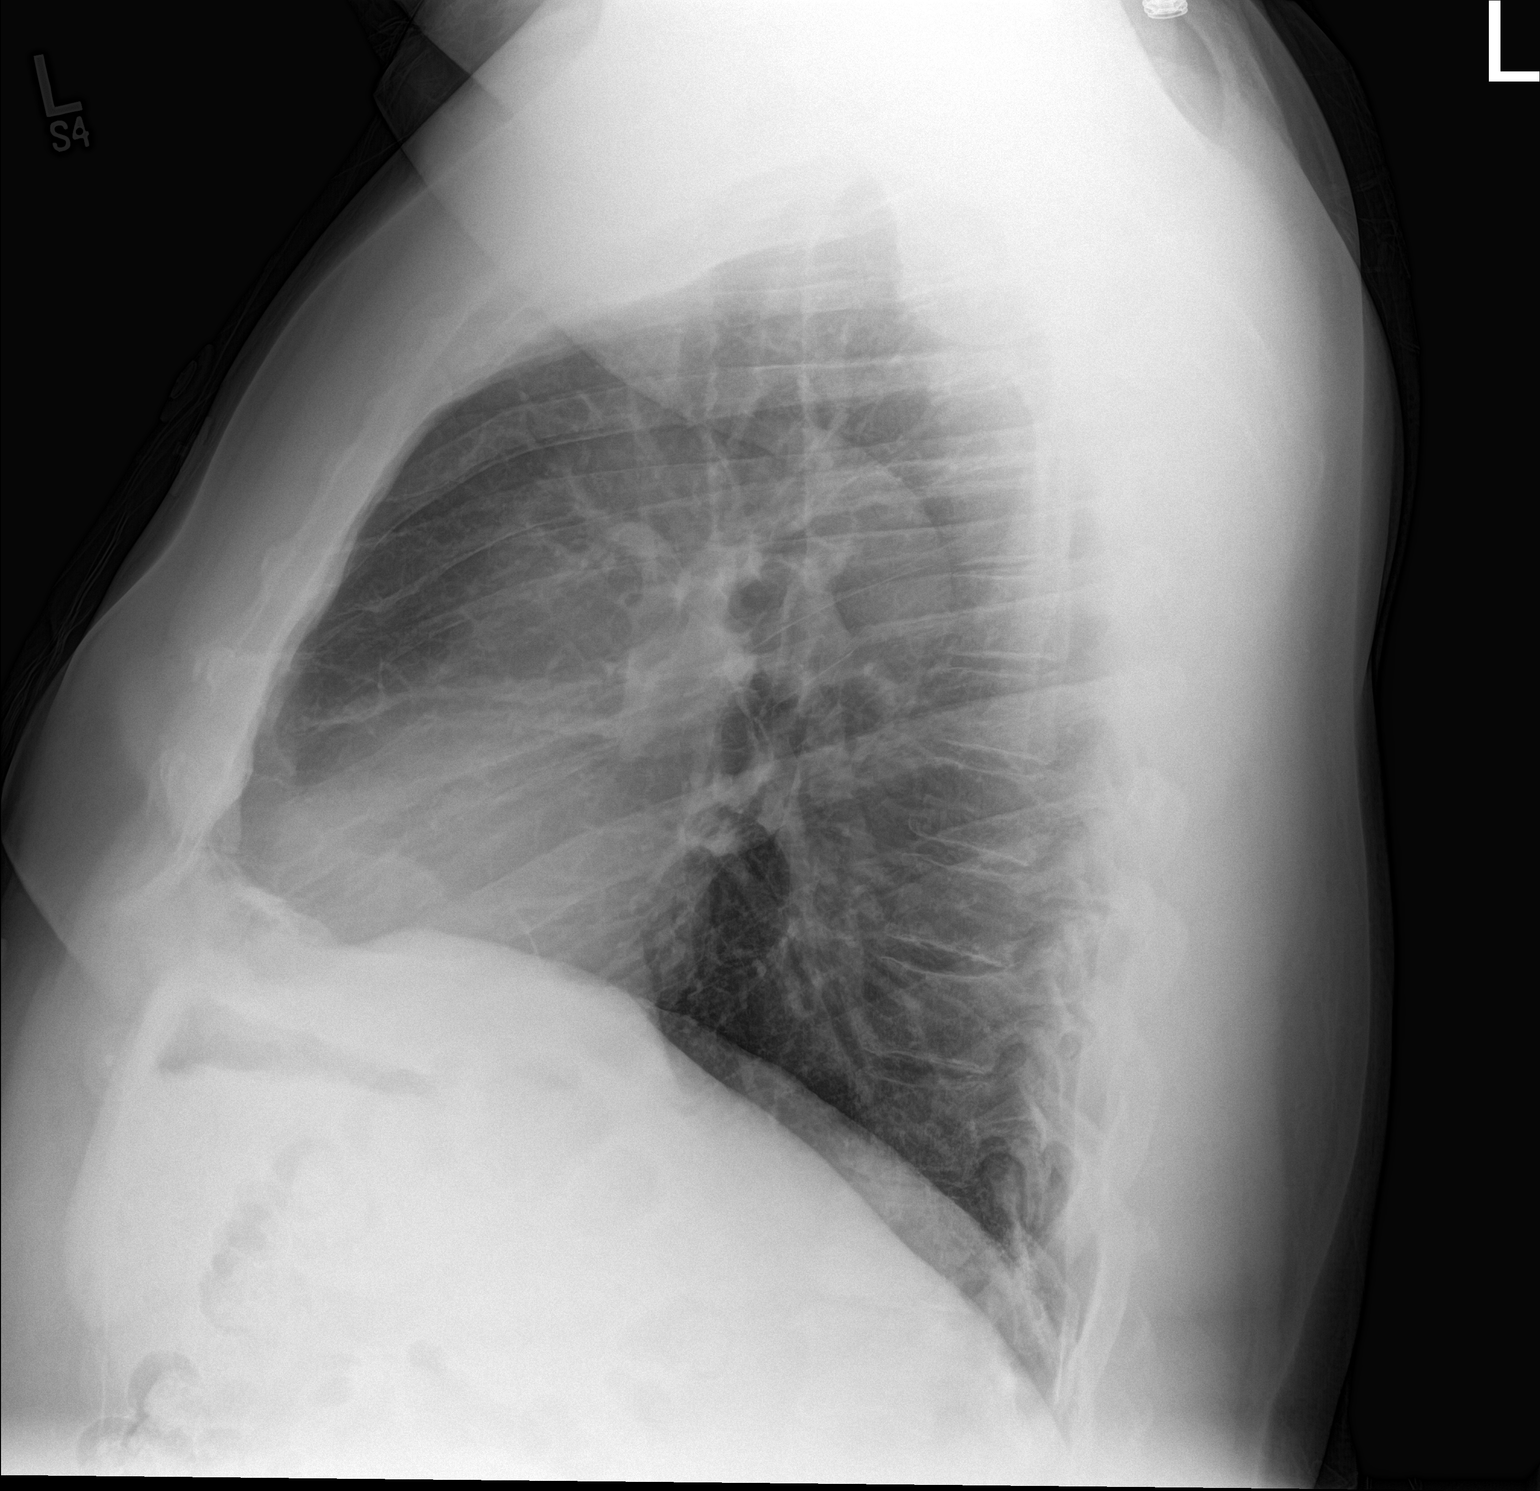

[2 of 2 positions shown; findings below may reference images not displayed]

FINDINGS: The heart size and mediastinal contours are within normal limits.
Both lungs are clear. No pleural effusion or pneumothorax. The
visualized skeletal structures are unremarkable.
IMPRESSION: No active cardiopulmonary disease.

## 2017-09-06 IMAGING — CT CT ANGIO CHEST
2 of 6 series · 18 of 36 positions shown · IV contrast (Omni 300)
Comparison: Prior chest x-ray 06/21/2015

ADDENDUM:
Critical Value/emergent results were called by telephone at the time
of interpretation on 06/22/2015 at [DATE] to Dr. KEORUTWE MMAK DANSTER ,
who verbally acknowledged these results.
CLINICAL DATA: 54-year-old male with chest pain, exertional dyspnea
and elevated D-dimer

EXAM:
CT ANGIOGRAPHY CHEST WITH CONTRAST
TECHNIQUE: Multidetector CT imaging of the chest was performed using the
standard protocol during bolus administration of intravenous
contrast. Multiplanar CT image reconstructions and MIPs were
obtained to evaluate the vascular anatomy.
CONTRAST:  100mL OMNIPAQUE IOHEXOL 350 MG/ML SOLN

[Series 6: pe thins · axial · 0.87mm/px · z∈[+1352,+1581]mm · 17 of 508 slices shown]
[im 25/508  lung]
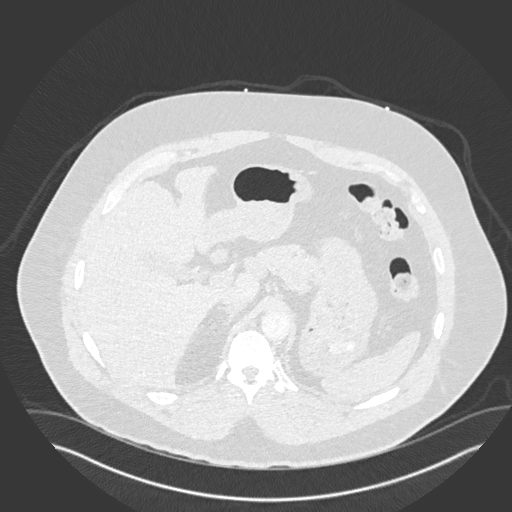
[im 49/508  mediastinal]
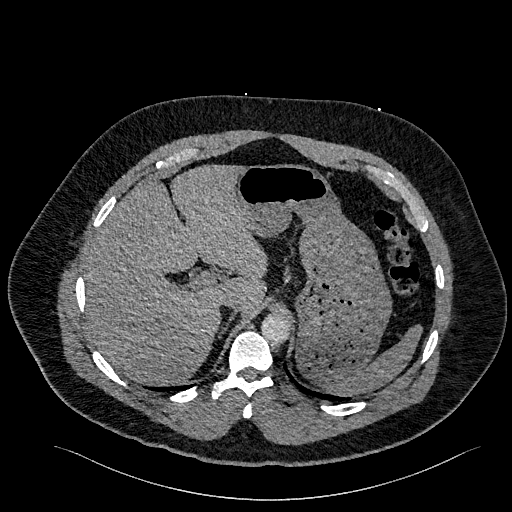
[im 73/508  lung]
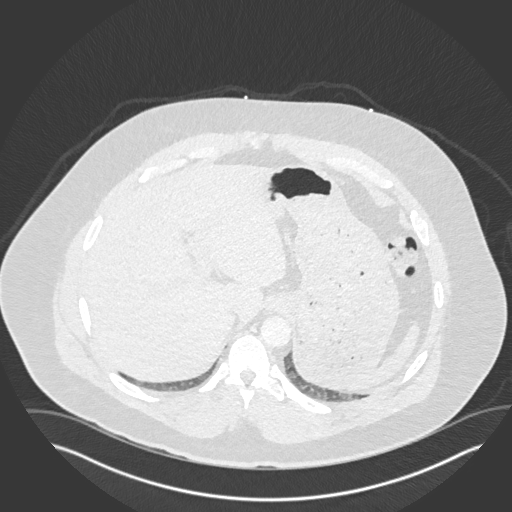
[im 121/508  mediastinal]
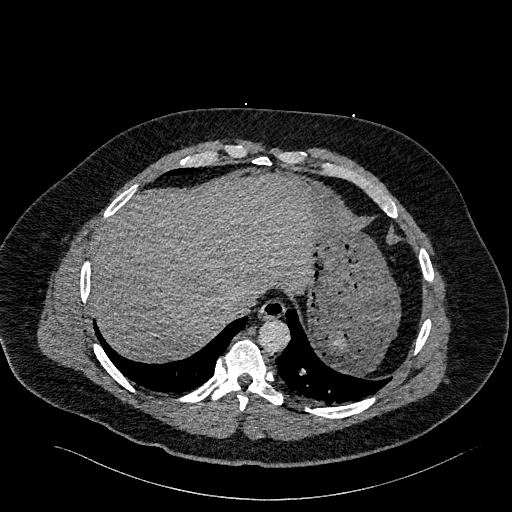
[im 145/508  lung]
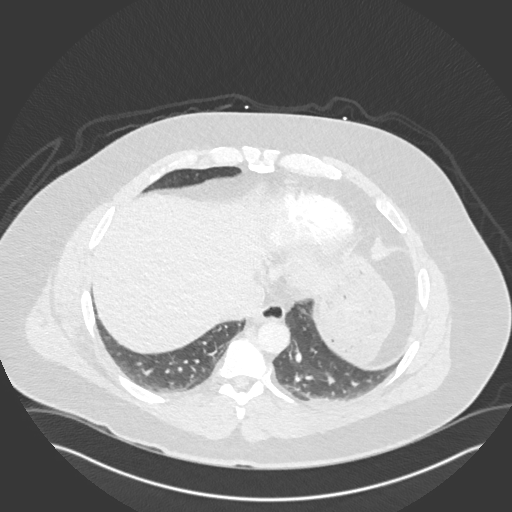
[im 170/508  mediastinal]
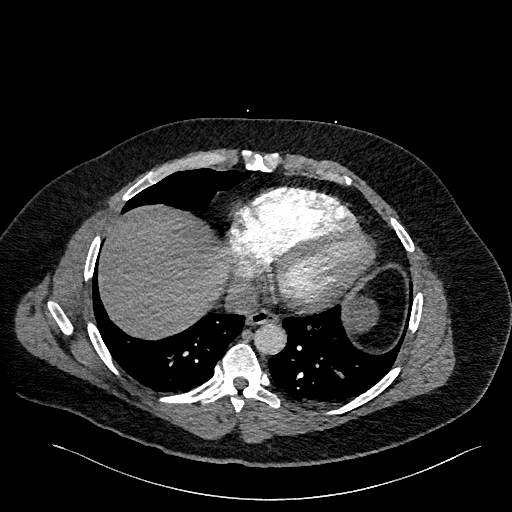
[im 194/508  lung]
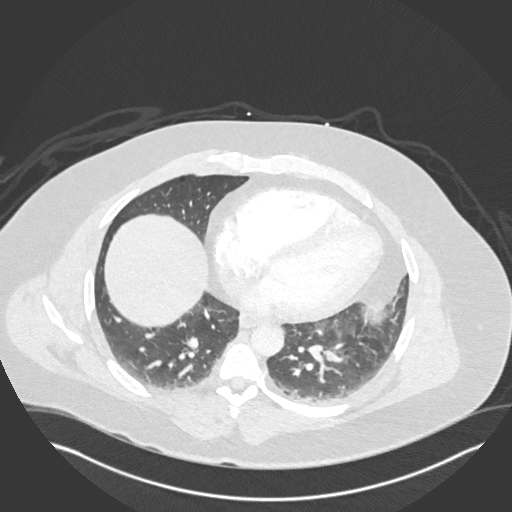
[im 218/508  mediastinal]
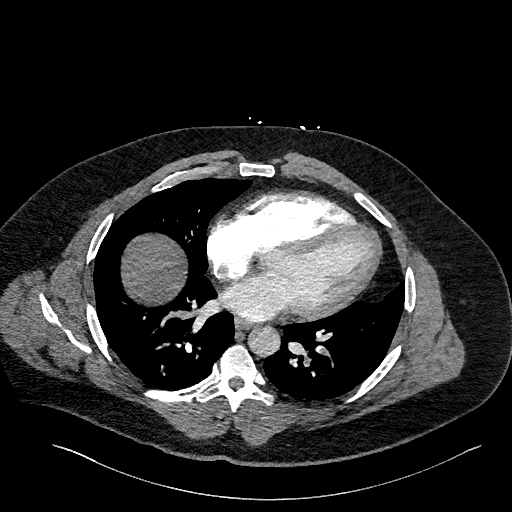
[im 266/508  lung]
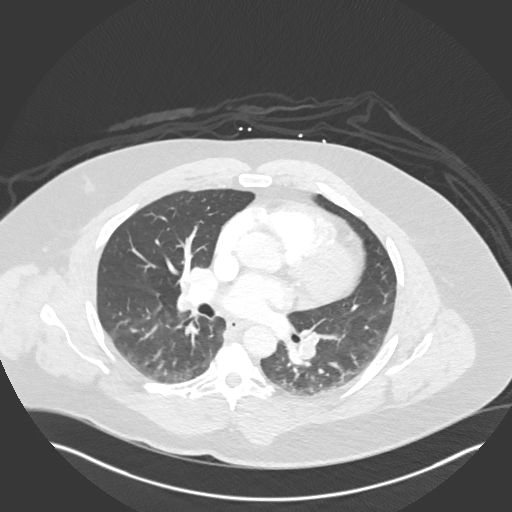
[im 290/508  mediastinal]
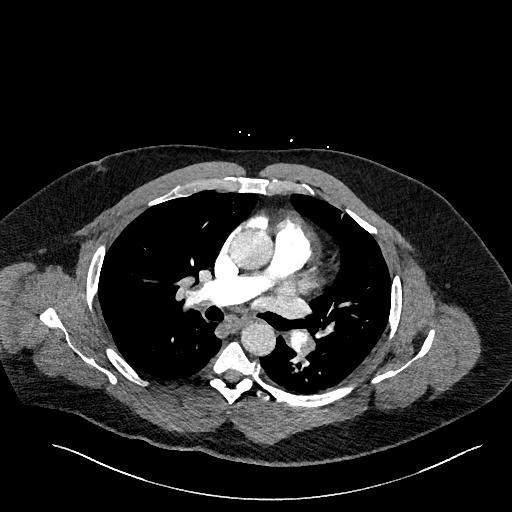
[im 314/508  lung]
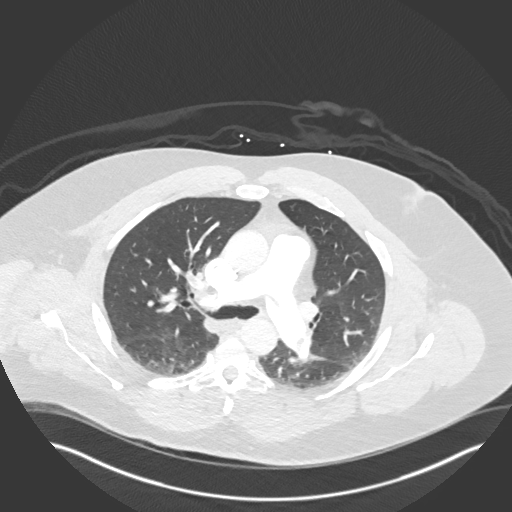
[im 339/508  mediastinal]
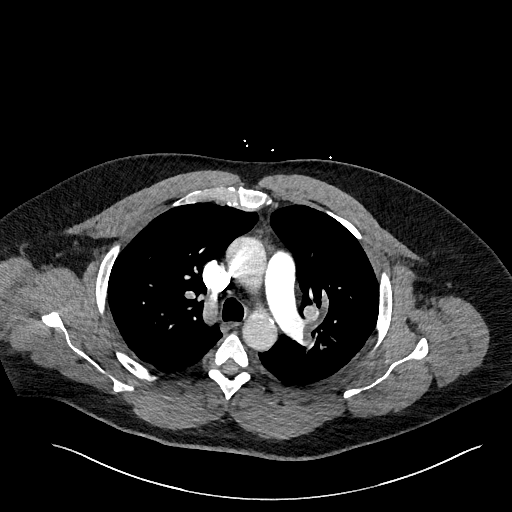
[im 363/508  lung]
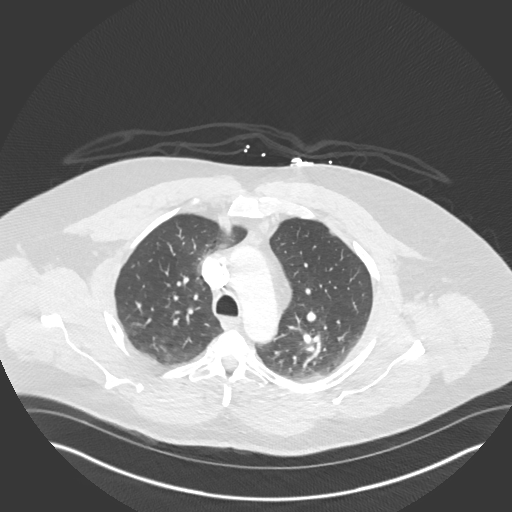
[im 387/508  mediastinal]
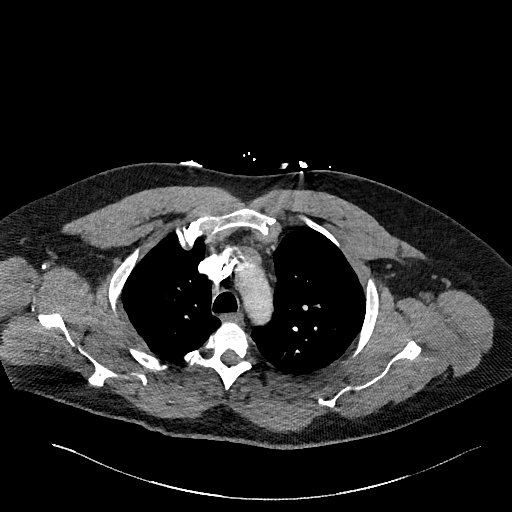
[im 435/508  lung]
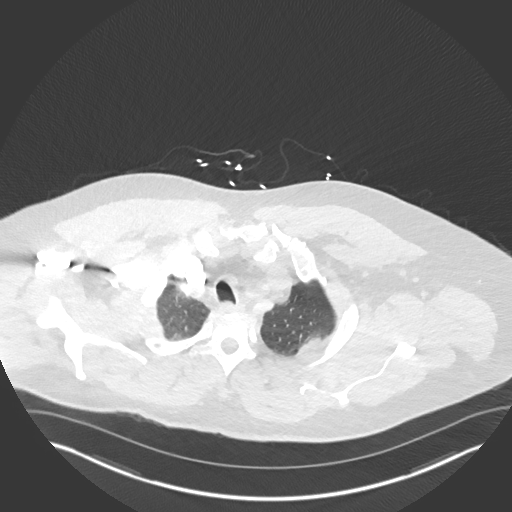
[im 459/508  mediastinal]
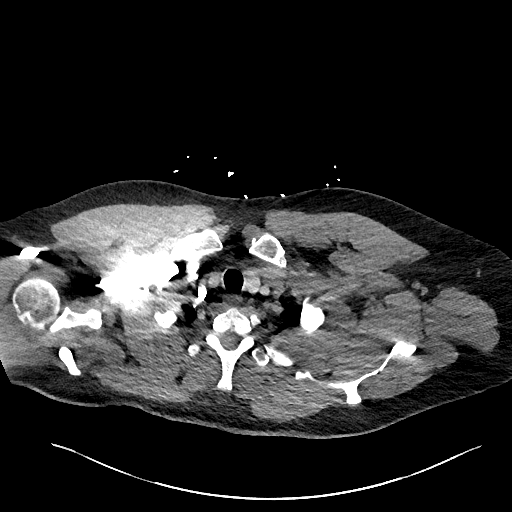
[im 483/508  lung]
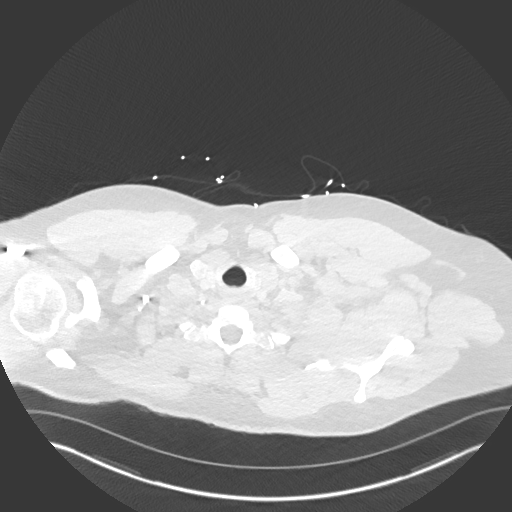

[Series 7: pe 2mm cor · coronal · 0.50mm/px · 1 of 127 slices shown]
[im 64/127  mediastinal]
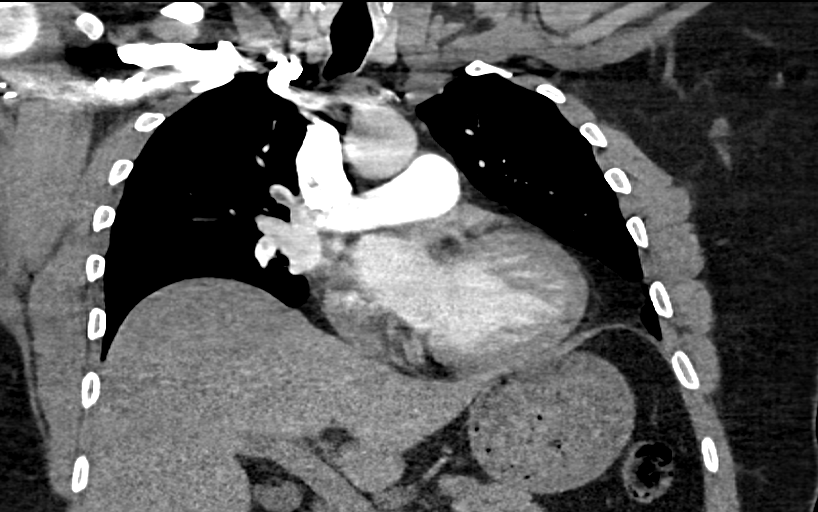

[18 of 36 positions shown; findings below may reference images not displayed]

FINDINGS: Mediastinum: Unremarkable CT appearance of the thyroid gland. No
suspicious mediastinal or hilar adenopathy. No soft tissue
mediastinal mass. The thoracic esophagus is unremarkable.

Heart/Vascular: Adequate opacification of the pulmonary arteries to
the proximal subsegmental level. There is large volume bilateral
pulmonary embolus including a saddle embolus. Emboli extend into
lobar, segmental and some subsegmental arteries throughout all lobes
of both lungs. The RV/LV ratio is elevated at 1.1 normal caliber
thoracic aorta. No pericardial effusion.

Lungs/Pleura: Dependent atelectasis in both lower lobes. No focal
airspace consolidation, pulmonary edema, pleural effusion or
pneumothorax. No suspicious pulmonary mass or nodule.

Bones/Soft Tissues: No acute fracture or aggressive appearing lytic
or blastic osseous lesion.

Upper Abdomen: Visualized upper abdominal organs are unremarkable.

Review of the MIP images confirms the above findings.
IMPRESSION: 1. Large volume bilateral PE including saddle embolus. Positive for
acute PE with CT evidence of right heart strain (RV/LV Ratio = 1.1)
consistent with at least submassive (intermediate risk) PE. The
presence of right heart strain has been associated with an increased
risk of morbidity and mortality. Please activate Code PE by paging
[phone].
2. Dependent atelectasis bilaterally.

## 2019-01-03 ENCOUNTER — Emergency Department (HOSPITAL_COMMUNITY): Payer: No Typology Code available for payment source

## 2019-01-03 ENCOUNTER — Emergency Department (HOSPITAL_COMMUNITY)
Admission: EM | Admit: 2019-01-03 | Discharge: 2019-01-03 | Disposition: A | Discharge status: Home / Self Care | Payer: No Typology Code available for payment source | Attending: Emergency Medicine | Admitting: Emergency Medicine

## 2019-01-03 ENCOUNTER — Encounter (HOSPITAL_COMMUNITY): Payer: Self-pay | Admitting: Emergency Medicine

## 2019-01-03 ENCOUNTER — Other Ambulatory Visit: Payer: Self-pay

## 2019-01-03 DIAGNOSIS — M25561 Pain in right knee: Secondary | ICD-10-CM | POA: Insufficient documentation

## 2019-01-03 DIAGNOSIS — I10 Essential (primary) hypertension: Secondary | ICD-10-CM | POA: Diagnosis not present

## 2019-01-03 DIAGNOSIS — M545 Low back pain: Secondary | ICD-10-CM | POA: Diagnosis not present

## 2019-01-03 DIAGNOSIS — R51 Headache: Secondary | ICD-10-CM | POA: Diagnosis not present

## 2019-01-03 DIAGNOSIS — M546 Pain in thoracic spine: Secondary | ICD-10-CM | POA: Diagnosis not present

## 2019-01-03 DIAGNOSIS — M542 Cervicalgia: Secondary | ICD-10-CM | POA: Insufficient documentation

## 2019-01-03 DIAGNOSIS — Z86711 Personal history of pulmonary embolism: Secondary | ICD-10-CM | POA: Diagnosis not present

## 2019-01-03 MED ORDER — METHOCARBAMOL 750 MG PO TABS: 750.0000 mg | ORAL_TABLET | Freq: Two times a day (BID) | ORAL | 0 refills | Status: DC
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
## 2019-01-03 NOTE — ED Notes (Signed)
Pt given diet ginger ale and graham crackers per PA.
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
## 2019-03-14 ENCOUNTER — Ambulatory Visit (HOSPITAL_COMMUNITY)
Admission: EM | Admit: 2019-03-14 | Discharge: 2019-03-14 | Disposition: A | Discharge status: Home / Self Care | Payer: Self-pay | Attending: Family Medicine | Admitting: Family Medicine

## 2019-03-14 ENCOUNTER — Encounter (HOSPITAL_COMMUNITY): Payer: Self-pay

## 2019-03-14 ENCOUNTER — Other Ambulatory Visit: Payer: Self-pay

## 2019-03-14 DIAGNOSIS — M25572 Pain in left ankle and joints of left foot: Secondary | ICD-10-CM

## 2019-03-14 MED ORDER — IBUPROFEN 600 MG PO TABS: 600.0000 mg | ORAL_TABLET | Freq: Four times a day (QID) | ORAL | 0 refills | Status: DC | PRN

## 2019-03-14 NOTE — ED Provider Notes (Signed)
[facility]    CSN: [id number] Arrival date & time: 03/14/19  1658     History   Chief Complaint Chief Complaint  Patient presents with  . Leg Pain    HPI Logan Mccarthy is a 58 y.o. male.   Patient presents with pain in his left foot x2 days.  He states the pain is worse with walking and weightbearing; relieved with rest and elevation.  He denies injury or falls.  He states he has a history of DVT but denies calf pain, redness, or swelling.  He denies leg or groin pain; denies numbness, paresthesias, weakness.    The history is provided by the patient.    Past Medical History:  Diagnosis Date  . Back pain   . Hypertension   . Sleep apnea    CPAP    Patient Active Problem List   Diagnosis Date Noted  . Pulmonary embolism without acute cor pulmonale (HCC)   . Elevated troponin 06/22/2015  . Chest pain 06/22/2015  . SOB (shortness of breath) 06/22/2015  . AKI (acute kidney injury) (HCC) 06/22/2015  . Essential hypertension   . Back pain   . Sleep apnea     Past Surgical History:  Procedure Laterality Date  . Heel spurs    . KNEE SURGERY Right   . Plantar fascitis         Home Medications    Prior to Admission medications   Medication Sig Start Date End Date Taking? Authorizing Provider  amLODipine (NORVASC) 10 MG tablet Take 10 mg by mouth daily.    [provider]  amoxicillin (AMOXIL) 500 MG capsule Take 2 capsules (1,000 mg total) by mouth 2 (two) times daily. 04/26/16   Pisciotta, Joni ReiningNicole, PA-C  ibuprofen (ADVIL) 600 MG tablet Take 1 tablet (600 mg total) by mouth every 6 (six) hours as needed. 03/14/19   Mickie Bailate, Chiann Goffredo H, NP  methocarbamol (ROBAXIN) 750 MG tablet Take 1 tablet (750 mg total) by mouth 2 (two) times daily. 01/03/19   Law, Waylan BogaAlexandra M, PA-C  oxyCODONE (OXYCONTIN) 60 MG 12 hr tablet Take 60 mg by mouth every 12 (twelve) hours.    [provider]  oxycodone (ROXICODONE) 30 MG immediate release tablet Take 30 mg by mouth  every 8 (eight) hours as needed for pain.    [provider]  rivaroxaban (XARELTO) 20 MG TABS tablet Take 1 tablet (20 mg total) by mouth daily with supper. Start on 07/14/15 07/14/15 03/14/19  Maretta BeesGhimire, Shanker M, MD    Family History Family History  Problem Relation Age of Onset  . Hypertension Mother     Social History Social History   Tobacco Use  . Smoking status: Never Smoker  . Smokeless tobacco: Never Used  Substance Use Topics  . Alcohol use: No  . Drug use: No     Allergies   Patient has no known allergies.   Review of Systems Review of Systems  Constitutional: Negative for chills and fever.  HENT: Negative for ear pain and sore throat.   Eyes: Negative for pain and visual disturbance.  Respiratory: Negative for cough and shortness of breath.   Cardiovascular: Negative for chest pain and palpitations.  Gastrointestinal: Negative for abdominal pain and vomiting.  Genitourinary: Negative for dysuria and hematuria.  Musculoskeletal: Positive for arthralgias. Negative for back pain.  Skin: Negative for color change and rash.  Neurological: Negative for seizures and syncope.  All other systems reviewed and are negative.    Physical  Exam Triage Vital Signs ED Triage Vitals  Enc Vitals Group     BP 03/14/19 1708 123/86     Pulse Rate 03/14/19 1708 78     Resp 03/14/19 1708 16     Temp 03/14/19 1708 98 F (36.7 C)     Temp Source 03/14/19 1708 Temporal     SpO2 03/14/19 1708 96 %     Weight 03/14/19 1733 255 lb (115.7 kg)     Height --      Head Circumference --      Peak Flow --      Pain Score 03/14/19 1733 8     Pain Loc --      Pain Edu? --      Excl. in GC? --    No data found.  Updated Vital Signs BP 123/86 (BP Location: Right Arm)   Pulse 78   Temp 98 F (36.7 C) (Temporal)   Resp 16   Wt 255 lb (115.7 kg)   SpO2 96%   BMI 37.66 kg/m   Visual Acuity Right Eye Distance:   Left Eye Distance:   Bilateral Distance:    Right  Eye Near:   Left Eye Near:    Bilateral Near:     Physical Exam Vitals signs and nursing note reviewed.  Constitutional:      Appearance: He is well-developed.  HENT:     Head: Normocephalic and atraumatic.  Eyes:     Conjunctiva/sclera: Conjunctivae normal.  Neck:     Musculoskeletal: Neck supple.  Cardiovascular:     Rate and Rhythm: Normal rate and regular rhythm.     Heart sounds: No murmur.  Pulmonary:     Effort: Pulmonary effort is normal. No respiratory distress.     Breath sounds: Normal breath sounds.  Abdominal:     Palpations: Abdomen is soft.     Tenderness: There is no abdominal tenderness.  Musculoskeletal: Normal range of motion.        General: No tenderness, deformity or signs of injury.     Comments: Left foot: Nontender to palpation; no erythema or ecchymosis. LLE: 2+ pulses, sensation intact, strength 5/5.   Bilateral LE: 1+ ankle edema L>R.   Skin:    General: Skin is warm and dry.     Findings: No bruising, erythema, lesion or rash.  Neurological:     General: No focal deficit present.     Mental Status: He is alert.     Sensory: No sensory deficit.     Motor: No weakness.     Coordination: Coordination normal.     Gait: Gait normal.     Deep Tendon Reflexes: Reflexes normal.      UC Treatments / Results  Labs (all labs ordered are listed, but only abnormal results are displayed) Labs Reviewed - No data to display  EKG   Radiology No results found.  Procedures Procedures (including critical care time)  Medications Ordered in UC Medications - No data to display  Initial Impression / Assessment and Plan / UC Course  I have reviewed the triage vital signs and the nursing notes.  Pertinent labs & imaging results that were available during my care of the patient were reviewed by me and considered in my medical decision making (see chart for details).   Left foot pain.  Treating today with ibuprofen, rest, ice, elevation, and Ace  wrap.  Instructed patient to follow-up with his PCP or orthopedist if his pain persist.  Instructed  patient to go to the emergency department if he develops symptoms consistent with DVT, including calf pain, redness, swelling.     Final Clinical Impressions(s) / UC Diagnoses   Final diagnoses:  Pain of joint of left ankle and foot     Discharge Instructions     Rest and elevate your foot as you are able.  Apply an ice pack 2-3 times a day for 20 minutes.  Take the ibuprofen prescribed as needed.    If your pain persists, consider following up with your primary care provider or with the orthopedic listed.    Go to the emergency department if you develop calf pain, redness, swelling.      ED Prescriptions    Medication Sig Dispense Auth. Provider   ibuprofen (ADVIL) 600 MG tablet Take 1 tablet (600 mg total) by mouth every 6 (six) hours as needed. 30 tablet Sharion Balloon, NP     Controlled Substance Prescriptions [place] Controlled Substance Registry consulted? Not Applicable   Sharion Balloon, NP 03/14/19 1825

## 2019-03-14 NOTE — Discharge Instructions (Signed)
Rest and elevate your foot as you are able.  Apply an ice pack 2-3 times a day for 20 minutes.  Take the ibuprofen prescribed as needed.    If your pain persists, consider following up with your primary care provider or with the orthopedic listed.    Go to the emergency department if you develop calf pain, redness, swelling.

## 2019-03-14 NOTE — ED Triage Notes (Signed)
Pt states he has leg and ankle pain. X 2 days.

## 2020-03-21 ENCOUNTER — Emergency Department (HOSPITAL_COMMUNITY): Payer: PRIVATE HEALTH INSURANCE

## 2020-03-21 ENCOUNTER — Other Ambulatory Visit: Payer: Self-pay

## 2020-03-21 ENCOUNTER — Emergency Department (HOSPITAL_COMMUNITY)
Admission: EM | Admit: 2020-03-21 | Discharge: 2020-03-21 | Disposition: A | Discharge status: Home / Self Care | Payer: PRIVATE HEALTH INSURANCE | Attending: Emergency Medicine | Admitting: Emergency Medicine

## 2020-03-21 ENCOUNTER — Encounter (HOSPITAL_COMMUNITY): Payer: Self-pay

## 2020-03-21 DIAGNOSIS — S76101A Unspecified injury of right quadriceps muscle, fascia and tendon, initial encounter: Secondary | ICD-10-CM | POA: Insufficient documentation

## 2020-03-21 DIAGNOSIS — Z20822 Contact with and (suspected) exposure to covid-19: Secondary | ICD-10-CM | POA: Insufficient documentation

## 2020-03-21 DIAGNOSIS — S82001A Unspecified fracture of right patella, initial encounter for closed fracture: Secondary | ICD-10-CM

## 2020-03-21 DIAGNOSIS — W214XXA Striking against diving board, initial encounter: Secondary | ICD-10-CM | POA: Insufficient documentation

## 2020-03-21 DIAGNOSIS — I10 Essential (primary) hypertension: Secondary | ICD-10-CM | POA: Insufficient documentation

## 2020-03-21 DIAGNOSIS — Y999 Unspecified external cause status: Secondary | ICD-10-CM | POA: Insufficient documentation

## 2020-03-21 DIAGNOSIS — Z7982 Long term (current) use of aspirin: Secondary | ICD-10-CM | POA: Insufficient documentation

## 2020-03-21 DIAGNOSIS — R52 Pain, unspecified: Secondary | ICD-10-CM

## 2020-03-21 DIAGNOSIS — S82031A Displaced transverse fracture of right patella, initial encounter for closed fracture: Secondary | ICD-10-CM | POA: Insufficient documentation

## 2020-03-21 DIAGNOSIS — S76111A Strain of right quadriceps muscle, fascia and tendon, initial encounter: Secondary | ICD-10-CM

## 2020-03-21 DIAGNOSIS — Y9234 Swimming pool (public) as the place of occurrence of the external cause: Secondary | ICD-10-CM | POA: Insufficient documentation

## 2020-03-21 DIAGNOSIS — Y9311 Activity, swimming: Secondary | ICD-10-CM | POA: Insufficient documentation

## 2020-03-21 LAB — SARS CORONAVIRUS 2 BY RT PCR (HOSPITAL ORDER, PERFORMED IN ~~LOC~~ HOSPITAL LAB): SARS Coronavirus 2: NEGATIVE

## 2020-03-21 MED ORDER — ASPIRIN EC 325 MG PO TBEC
325.0000 mg | DELAYED_RELEASE_TABLET | Freq: Every day | ORAL | 0 refills | Status: DC
Start: 2020-03-21 — End: 2023-05-29

## 2020-03-21 NOTE — ED Notes (Signed)
Discharge instructions discussed with pt. Pt verbalized understanding with no questions at this time.  

## 2020-03-21 NOTE — ED Notes (Signed)
Patient transported to MRI 

## 2020-03-21 NOTE — ED Notes (Signed)
Dressed pt's scrapes on foot and applied bacitracin.

## 2020-03-21 NOTE — Progress Notes (Signed)
Orthopedic Tech Progress Note Patient Details:  Logan Mccarthy 1961/07/09 [id number]  Ortho Devices Type of Ortho Device: Crutches, Knee Immobilizer Ortho Device/Splint Location: Bi LE Ortho Device/Splint Interventions: Application, Adjustment   Post Interventions Patient Tolerated: Well, Ambulated well Instructions Provided: Adjustment of device, Poper ambulation with device   Chimaobi Casebolt E Ashlyne Olenick 03/21/2020, 11:09 PM
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
## 2020-03-21 NOTE — Care Management (Signed)
ED CM was consulted by EDP to assist with ordering a w/c. Patient has a fractured patella and is not able to ambulate. CM faxed DME order for lightweight w/c sent to [facility] via [facility].
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
## 2020-03-21 NOTE — Discharge Instructions (Signed)
Keep your legs straight.  You will be getting a wheelchair at your house too.  Follow-up with orthopedic surgery.  You should be being contacted by the office if not you can contact them.  Take a full dose aspirin until the surgery after that you will be started on the blood thinner.

## 2020-03-21 NOTE — ED Notes (Signed)
Patient transported to X-ray 

## 2020-03-26 ENCOUNTER — Ambulatory Visit: Payer: Self-pay | Admitting: Orthopaedic Surgery

## 2020-03-29 ENCOUNTER — Ambulatory Visit: Payer: Self-pay | Admitting: Orthopaedic Surgery

## 2020-04-04 ENCOUNTER — Encounter: Payer: Self-pay | Admitting: Physician Assistant

## 2020-04-04 ENCOUNTER — Ambulatory Visit (INDEPENDENT_AMBULATORY_CARE_PROVIDER_SITE_OTHER): Payer: Self-pay | Admitting: Orthopaedic Surgery

## 2020-04-04 DIAGNOSIS — S86891A Other injury of other muscle(s) and tendon(s) at lower leg level, right leg, initial encounter: Secondary | ICD-10-CM

## 2020-04-04 DIAGNOSIS — S76112A Strain of left quadriceps muscle, fascia and tendon, initial encounter: Secondary | ICD-10-CM

## 2020-04-04 MED ORDER — HYDROCODONE-ACETAMINOPHEN 5-325 MG PO TABS: 1.0000 | ORAL_TABLET | Freq: Two times a day (BID) | ORAL | 0 refills | Status: DC | PRN
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
## 2020-04-10 ENCOUNTER — Telehealth: Payer: Self-pay | Admitting: Orthopaedic Surgery

## 2020-04-10 NOTE — Telephone Encounter (Signed)
Pt called stating he has been waiting to get scheduled for surgery and has left a message for the scheduler but due to their VM asking the pts not to call multiple times he would like to make Dr.Xu aware of the hold up  [phone]; only try the [phone] if he doesn't answer

## 2020-04-10 NOTE — Telephone Encounter (Signed)
Dr Roda Shutters is aware this is being addressed.

## 2020-04-11 NOTE — Telephone Encounter (Signed)
I called and spoke with patient.  Advised working on getting him scheduled.  Will call him back with date and time for surgery.

## 2020-04-13 ENCOUNTER — Encounter (HOSPITAL_COMMUNITY): Payer: Self-pay

## 2020-04-13 ENCOUNTER — Emergency Department (HOSPITAL_COMMUNITY)
Admission: EM | Admit: 2020-04-13 | Discharge: 2020-04-14 | Disposition: A | Discharge status: Home / Self Care | Payer: Self-pay | Attending: Emergency Medicine | Admitting: Emergency Medicine

## 2020-04-13 ENCOUNTER — Emergency Department (HOSPITAL_COMMUNITY): Payer: Self-pay

## 2020-04-13 DIAGNOSIS — W19XXXA Unspecified fall, initial encounter: Secondary | ICD-10-CM

## 2020-04-13 DIAGNOSIS — Y999 Unspecified external cause status: Secondary | ICD-10-CM | POA: Insufficient documentation

## 2020-04-13 DIAGNOSIS — W01198A Fall on same level from slipping, tripping and stumbling with subsequent striking against other object, initial encounter: Secondary | ICD-10-CM | POA: Insufficient documentation

## 2020-04-13 DIAGNOSIS — Y9389 Activity, other specified: Secondary | ICD-10-CM | POA: Insufficient documentation

## 2020-04-13 DIAGNOSIS — I10 Essential (primary) hypertension: Secondary | ICD-10-CM | POA: Insufficient documentation

## 2020-04-13 DIAGNOSIS — Z79899 Other long term (current) drug therapy: Secondary | ICD-10-CM | POA: Insufficient documentation

## 2020-04-13 DIAGNOSIS — Y9289 Other specified places as the place of occurrence of the external cause: Secondary | ICD-10-CM | POA: Insufficient documentation

## 2020-04-13 DIAGNOSIS — Z7982 Long term (current) use of aspirin: Secondary | ICD-10-CM | POA: Insufficient documentation

## 2020-04-13 DIAGNOSIS — S82001A Unspecified fracture of right patella, initial encounter for closed fracture: Secondary | ICD-10-CM | POA: Insufficient documentation
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
## 2020-04-14 NOTE — ED Provider Notes (Signed)
[facility] EMERGENCY DEPARTMENT Provider Note   CSN: [id number] Arrival date & time: 04/13/20  1851     History Chief Complaint  Patient presents with  . Fall    Logan Mccarthy is a 59 y.o. male.  HPI Patient is a 59 year old male with a medical history as noted below.  Patient was initially evaluated on August 5 for a patella fracture on the right as well as a quadriceps tendon rupture on the left.  MRI was performed at that time and he subsequently followed up with Dr. Roda Shutters with orthopedic surgery on August 19.  Patient was given narcotic pain medication, crutches, knee immobilizers bilaterally.  He states he has been wearing them without difficulty.  He states yesterday he had removed his knee immobilizers and was standing and felt his "knees buckle" and then had a controlled fall forwards to the ground.  He states that he caught himself and was just attempting to keep his leg straight.  He reports worsening swelling in the bilateral knees but states his pain is well controlled and is about 4/10 at the moment.  He came to the emergency department for reevaluation.  He denies head trauma, LOC, numbness, tingling, weakness.    Past Medical History:  Diagnosis Date  . Back pain   . Hypertension   . Sleep apnea    CPAP    Patient Active Problem List   Diagnosis Date Noted  . Pulmonary embolism without acute cor pulmonale (HCC)   . Elevated troponin 06/22/2015  . Chest pain 06/22/2015  . SOB (shortness of breath) 06/22/2015  . AKI (acute kidney injury) (HCC) 06/22/2015  . Essential hypertension   . Back pain   . Sleep apnea     Past Surgical History:  Procedure Laterality Date  . Heel spurs    . KNEE SURGERY Right   . Plantar fascitis         Family History  Problem Relation Age of Onset  . Hypertension Mother     Social History   Tobacco Use  . Smoking status: Never Smoker  . Smokeless tobacco: Never Used  Substance Use Topics  . Alcohol use:  No  . Drug use: No    Home Medications Prior to Admission medications   Medication Sig Start Date End Date Taking? Authorizing Provider  amLODipine (NORVASC) 10 MG tablet Take 10 mg by mouth daily.    [provider]  amoxicillin (AMOXIL) 500 MG capsule Take 2 capsules (1,000 mg total) by mouth 2 (two) times daily. Patient not taking: Reported on 03/21/2020 04/26/16   Pisciotta, Mardella Layman  aspirin EC 325 MG tablet Take 1 tablet (325 mg total) by mouth daily. 03/21/20   Benjiman Core, MD  HYDROcodone-acetaminophen (NORCO) 5-325 MG tablet Take 1-2 tablets by mouth 2 (two) times daily as needed. 04/04/20   Cristie Hem, PA-C  ibuprofen (ADVIL) 600 MG tablet Take 1 tablet (600 mg total) by mouth every 6 (six) hours as needed. Patient not taking: Reported on 03/21/2020 03/14/19   Mickie Bail, NP  losartan (COZAAR) 50 MG tablet Take 50 mg by mouth daily.    [provider]  methocarbamol (ROBAXIN) 750 MG tablet Take 1 tablet (750 mg total) by mouth 2 (two) times daily. Patient not taking: Reported on 03/21/2020 01/03/19   Emi Holes, PA-C  Oxycodone HCl 20 MG TABS Take 20 mg by mouth 3 (three) times daily as needed (for pain).    [provider]  rivaroxaban (XARELTO) 20 MG TABS tablet Take 1 tablet (20 mg total) by mouth daily with supper. Start on 07/14/15 07/14/15 03/14/19  Maretta Bees, MD    Allergies    Patient has no known allergies.  Review of Systems   Review of Systems  Musculoskeletal: Positive for arthralgias, joint swelling and myalgias.  Neurological: Negative for syncope, weakness and numbness.    Physical Exam Updated Vital Signs BP (!) 116/50 (BP Location: Right Arm)   Pulse 70   Temp 98.5 F (36.9 C) (Oral)   Resp 18   SpO2 100%   Physical Exam Vitals and nursing note reviewed.  Constitutional:      General: He is not in acute distress.    Appearance: Normal appearance. He is not ill-appearing, toxic-appearing or  diaphoretic.  HENT:     Head: Normocephalic and atraumatic.     Comments: Extraocular movements intact.  No visible head trauma.    Right Ear: External ear normal.     Left Ear: External ear normal.     Nose: Nose normal.     Mouth/Throat:     Mouth: Mucous membranes are moist.     Pharynx: Oropharynx is clear. No oropharyngeal exudate or posterior oropharyngeal erythema.  Eyes:     Extraocular Movements: Extraocular movements intact.  Cardiovascular:     Rate and Rhythm: Normal rate and regular rhythm.     Pulses: Normal pulses.     Heart sounds: Normal heart sounds. No murmur heard.  No friction rub. No gallop.   Pulmonary:     Effort: Pulmonary effort is normal. No respiratory distress.     Breath sounds: Normal breath sounds. No stridor. No wheezing, rhonchi or rales.  Abdominal:     General: Abdomen is flat.     Palpations: Abdomen is soft.     Tenderness: There is no abdominal tenderness.  Musculoskeletal:        General: Swelling and tenderness present. Normal range of motion.     Cervical back: Normal range of motion and neck supple. No tenderness.     Comments: Diffuse TTP and moderate edema noted along the bilateral knees circumferentially.  Swelling is worst in the left knee along the superior lateral aspect of the anterior knee.  Unable to assess range of motion secondary to patient's injuries.  Distal sensation intact in the bilateral lower extremities.  Patient is able to wiggle the toes of both feet without difficulty.  2+ DP pulses noted bilaterally.  Skin:    General: Skin is warm and dry.  Neurological:     General: No focal deficit present.     Mental Status: He is alert and oriented to person, place, and time.     Comments: Patient is oriented to person, place, time.  He is speaking clearly and coherently.  Psychiatric:        Mood and Affect: Mood normal.        Behavior: Behavior normal.    ED Results / Procedures / Treatments   Labs (all labs ordered  are listed, but only abnormal results are displayed) Labs Reviewed - No data to display  EKG None  Radiology DG Knee Complete 4 Views Left  Result Date: 04/13/2020 CLINICAL DATA:  Status post fall. EXAM: LEFT KNEE - COMPLETE 4+ VIEW COMPARISON:  March 21, 2020 FINDINGS: No evidence of an acute fracture or dislocation. Superior and inferior patellar enthesophytes are seen. A small to moderate sized joint effusion is noted. This is increased  in size when compared to the prior study. IMPRESSION: Small to moderate sized joint effusion, increased in size when compared to the prior study. Electronically Signed   By: Aram Candela M.D.   On: 04/13/2020 20:19   DG Knee Complete 4 Views Right  Result Date: 04/13/2020 CLINICAL DATA:  Status post fall. EXAM: RIGHT KNEE - COMPLETE 4+ VIEW COMPARISON:  March 21, 2020 FINDINGS: Acute displaced fracture is seen involving the inferior aspect of the right patella. There is interval increase in the degree of displacement of the fracture site when compared to the prior study (approximately 3.0 cm on the current exam). No evidence of arthropathy or other focal bone abnormality. Mild to moderate severity soft tissue swelling is seen along the previously noted fracture site. IMPRESSION: Acute displaced fracture of the right patella with interval increase in the degree of displacement when compared to the prior study. Electronically Signed   By: Aram Candela M.D.   On: 04/13/2020 20:16   Procedures Procedures (including critical care time)  Medications Ordered in ED Medications - No data to display  ED Course  I have reviewed the triage vital signs and the nursing notes.  Pertinent labs & imaging results that were available during my care of the patient were reviewed by me and considered in my medical decision making (see chart for details).    MDM Rules/Calculators/A&P                          Pt is a 59 y.o. male that presents with a history,  physical exam, and ED Clinical Course as noted above.   Patient was discussed with Dr. Magnus Ivan with orthopedic surgery.  Recommended the patient keep his knee immobilizers in place and follow-up with their office tomorrow.  Patient states that he has a follow-up appointment with Dr. Roda Shutters on Tuesday morning.  Urged patient to make sure that he keeps his appointment.  He still has an adequate amount of hydrocodone at home and denies needing additional medication.  Patient feels that he is safe for discharge at this time and I am in agreement.  His wife has been helping take care of him and patient states that he is not having difficulty with ADLs despite his injuries.  His questions were answered and he was amicable at the time of discharge.  His vital signs are stable.  Note: Portions of this report may have been transcribed using voice recognition software. Every effort was made to ensure accuracy; however, inadvertent computerized transcription errors may be present.   Final Clinical Impression(s) / ED Diagnoses Final diagnoses:  Fall, initial encounter  Closed displaced fracture of right patella, unspecified fracture morphology, initial encounter    Rx / DC Orders ED Discharge Orders    None       Placido Sou, PA-C 04/14/20 2025    Tilden Fossa, MD 04/14/20 [phone]

## 2020-04-14 NOTE — Discharge Instructions (Signed)
Please make sure you go to your appointment on Tuesday morning.  You can continue to take your hydrocodone for pain management.  Please keep your knees immobilized.  You can always return to the emergency department with new or worsening symptoms.  It was a pleasure to meet you.

## 2020-04-15 ENCOUNTER — Telehealth: Payer: Self-pay

## 2020-04-15 ENCOUNTER — Other Ambulatory Visit: Payer: Self-pay

## 2020-04-15 ENCOUNTER — Encounter (HOSPITAL_BASED_OUTPATIENT_CLINIC_OR_DEPARTMENT_OTHER): Payer: Self-pay | Admitting: Orthopaedic Surgery

## 2020-04-15 NOTE — Telephone Encounter (Signed)
Called patient.  No answer.

## 2020-04-15 NOTE — Telephone Encounter (Signed)
Patient called in to notify he recently fell over the weekend and had to get x rays done . Patient said his surgery is tomorrow and was trying to figure out what next steps to take .. also wanted Dr Roda Shutters to take a look at x rays .   Patient advised if he doesn't answer [phone] try calling [phone]

## 2020-04-16 ENCOUNTER — Other Ambulatory Visit (HOSPITAL_COMMUNITY)
Admission: RE | Admit: 2020-04-16 | Discharge: 2020-04-16 | Disposition: A | Discharge status: Home / Self Care | Payer: HRSA Program | Source: Ambulatory Visit | Attending: Orthopaedic Surgery | Admitting: Orthopaedic Surgery

## 2020-04-16 ENCOUNTER — Telehealth: Payer: Self-pay

## 2020-04-16 ENCOUNTER — Ambulatory Visit: Payer: Self-pay

## 2020-04-16 DIAGNOSIS — S86891A Other injury of other muscle(s) and tendon(s) at lower leg level, right leg, initial encounter: Secondary | ICD-10-CM

## 2020-04-16 DIAGNOSIS — Z20822 Contact with and (suspected) exposure to covid-19: Secondary | ICD-10-CM | POA: Diagnosis not present

## 2020-04-16 DIAGNOSIS — Z01812 Encounter for preprocedural laboratory examination: Secondary | ICD-10-CM | POA: Diagnosis present

## 2020-04-16 DIAGNOSIS — S76112A Strain of left quadriceps muscle, fascia and tendon, initial encounter: Secondary | ICD-10-CM

## 2020-04-16 LAB — SARS CORONAVIRUS 2 (TAT 6-24 HRS): SARS Coronavirus 2: NEGATIVE

## 2020-04-16 NOTE — Telephone Encounter (Signed)
Per patient he wanted letter to be faxed to fax number [phone].   Faxed note.

## 2020-04-16 NOTE — Progress Notes (Signed)
Patient came in today for a nurse visit only. He got 2 Bledsoe braces.

## 2020-04-16 NOTE — Telephone Encounter (Signed)
Per Patient he wanted me to give this to you.    MVP ID #: [id number] FAX # [phone] AUTH #: [id number]  States they approved his SU.  Please call patient. [phone]

## 2020-04-19 ENCOUNTER — Ambulatory Visit (HOSPITAL_BASED_OUTPATIENT_CLINIC_OR_DEPARTMENT_OTHER): Payer: Self-pay | Admitting: Anesthesiology

## 2020-04-19 ENCOUNTER — Encounter (HOSPITAL_BASED_OUTPATIENT_CLINIC_OR_DEPARTMENT_OTHER)
Admission: RE | Disposition: A | Discharge status: Home / Self Care | Payer: Self-pay | Source: Home / Self Care | Attending: Orthopaedic Surgery

## 2020-04-19 ENCOUNTER — Encounter (HOSPITAL_BASED_OUTPATIENT_CLINIC_OR_DEPARTMENT_OTHER): Payer: Self-pay | Admitting: Orthopaedic Surgery

## 2020-04-19 ENCOUNTER — Other Ambulatory Visit: Payer: Self-pay

## 2020-04-19 ENCOUNTER — Ambulatory Visit (HOSPITAL_BASED_OUTPATIENT_CLINIC_OR_DEPARTMENT_OTHER)
Admission: RE | Admit: 2020-04-19 | Discharge: 2020-04-19 | Disposition: A | Discharge status: Home / Self Care | Payer: Self-pay | Attending: Orthopaedic Surgery | Admitting: Orthopaedic Surgery

## 2020-04-19 DIAGNOSIS — S82041A Displaced comminuted fracture of right patella, initial encounter for closed fracture: Secondary | ICD-10-CM

## 2020-04-19 DIAGNOSIS — G4730 Sleep apnea, unspecified: Secondary | ICD-10-CM | POA: Insufficient documentation

## 2020-04-19 DIAGNOSIS — Z86711 Personal history of pulmonary embolism: Secondary | ICD-10-CM | POA: Insufficient documentation

## 2020-04-19 DIAGNOSIS — S76112A Strain of left quadriceps muscle, fascia and tendon, initial encounter: Secondary | ICD-10-CM | POA: Insufficient documentation

## 2020-04-19 DIAGNOSIS — Z8249 Family history of ischemic heart disease and other diseases of the circulatory system: Secondary | ICD-10-CM | POA: Insufficient documentation

## 2020-04-19 DIAGNOSIS — S82001A Unspecified fracture of right patella, initial encounter for closed fracture: Secondary | ICD-10-CM | POA: Insufficient documentation

## 2020-04-19 DIAGNOSIS — Y939 Activity, unspecified: Secondary | ICD-10-CM | POA: Insufficient documentation

## 2020-04-19 DIAGNOSIS — Z7982 Long term (current) use of aspirin: Secondary | ICD-10-CM | POA: Insufficient documentation

## 2020-04-19 DIAGNOSIS — I10 Essential (primary) hypertension: Secondary | ICD-10-CM | POA: Insufficient documentation

## 2020-04-19 DIAGNOSIS — Z7901 Long term (current) use of anticoagulants: Secondary | ICD-10-CM | POA: Insufficient documentation

## 2020-04-19 DIAGNOSIS — M66269 Spontaneous rupture of extensor tendons, unspecified lower leg: Secondary | ICD-10-CM

## 2020-04-19 DIAGNOSIS — X58XXXA Exposure to other specified factors, initial encounter: Secondary | ICD-10-CM | POA: Insufficient documentation

## 2020-04-19 DIAGNOSIS — Z79899 Other long term (current) drug therapy: Secondary | ICD-10-CM | POA: Insufficient documentation

## 2020-04-19 HISTORY — DX: Strain of left quadriceps muscle, fascia and tendon, initial encounter: S76.112A

## 2020-04-19 HISTORY — DX: Other injury of other muscle(s) and tendon(s) at lower leg level, right leg, initial encounter: S86.891A

## 2020-04-19 HISTORY — PX: QUADRICEPS TENDON REPAIR: SHX756

## 2020-04-19 HISTORY — PX: PATELLAR TENDON REPAIR: SHX737

## 2020-04-19 SURGERY — REPAIR, TENDON, QUADRICEPS
Anesthesia: General | Site: Knee | Laterality: Right

## 2020-04-19 MED ORDER — RIVAROXABAN 10 MG PO TABS: 10.0000 mg | ORAL_TABLET | Freq: Every day | ORAL | 0 refills | Status: DC

## 2020-04-19 MED ORDER — HYDROMORPHONE HCL 1 MG/ML IJ SOLN
0.2500 mg | INTRAMUSCULAR | Status: DC | PRN
  Administered 2020-04-19 (×4): 0.5 mg via INTRAVENOUS

## 2020-04-19 MED ORDER — FENTANYL CITRATE (PF) 100 MCG/2ML IJ SOLN
INTRAMUSCULAR | Status: AC
  Filled 2020-04-19: qty 2

## 2020-04-19 MED ORDER — EPHEDRINE 5 MG/ML INJ
INTRAVENOUS | Status: AC
  Filled 2020-04-19: qty 10

## 2020-04-19 MED ORDER — VANCOMYCIN HCL 1000 MG IV SOLR
INTRAVENOUS | Status: AC
  Filled 2020-04-19: qty 1000

## 2020-04-19 MED ORDER — METHOCARBAMOL 750 MG PO TABS: 750.0000 mg | ORAL_TABLET | Freq: Two times a day (BID) | ORAL | 3 refills | Status: DC | PRN

## 2020-04-19 MED ORDER — OXYCODONE-ACETAMINOPHEN 5-325 MG PO TABS: 1.0000 | ORAL_TABLET | Freq: Three times a day (TID) | ORAL | 0 refills | Status: DC | PRN

## 2020-04-19 MED ORDER — VANCOMYCIN HCL 1000 MG IV SOLR
INTRAVENOUS | Status: DC | PRN
  Administered 2020-04-19 (×2): 1000 mg

## 2020-04-19 MED ORDER — MIDAZOLAM HCL 2 MG/2ML IJ SOLN
INTRAMUSCULAR | Status: AC
  Filled 2020-04-19: qty 2

## 2020-04-19 MED ORDER — CEFAZOLIN SODIUM-DEXTROSE 2-4 GM/100ML-% IV SOLN
2.0000 g | INTRAVENOUS | Status: AC
  Administered 2020-04-19: 2 g via INTRAVENOUS

## 2020-04-19 MED ORDER — HYDROMORPHONE HCL 1 MG/ML IJ SOLN
INTRAMUSCULAR | Status: AC
  Filled 2020-04-19: qty 0.5

## 2020-04-19 MED ORDER — LACTATED RINGERS IV SOLN: INTRAVENOUS | Status: DC

## 2020-04-19 MED ORDER — LIDOCAINE HCL (CARDIAC) PF 100 MG/5ML IV SOSY
PREFILLED_SYRINGE | INTRAVENOUS | Status: DC | PRN
  Administered 2020-04-19: 100 mg via INTRATRACHEAL

## 2020-04-19 MED ORDER — CEFAZOLIN SODIUM-DEXTROSE 2-4 GM/100ML-% IV SOLN
INTRAVENOUS | Status: AC
  Filled 2020-04-19: qty 100

## 2020-04-19 MED ORDER — MIDAZOLAM HCL 2 MG/2ML IJ SOLN
2.0000 mg | Freq: Once | INTRAMUSCULAR | Status: AC
  Administered 2020-04-19: 1 mg via INTRAVENOUS

## 2020-04-19 MED ORDER — MIDAZOLAM HCL 5 MG/5ML IJ SOLN
INTRAMUSCULAR | Status: DC | PRN
  Administered 2020-04-19: 2 mg via INTRAVENOUS

## 2020-04-19 MED ORDER — OXYCODONE HCL ER 10 MG PO T12A: 10.0000 mg | EXTENDED_RELEASE_TABLET | Freq: Two times a day (BID) | ORAL | 0 refills | Status: AC

## 2020-04-19 MED ORDER — PROPOFOL 10 MG/ML IV BOLUS
INTRAVENOUS | Status: DC | PRN
  Administered 2020-04-19: 180 mg via INTRAVENOUS

## 2020-04-19 MED ORDER — FENTANYL CITRATE (PF) 100 MCG/2ML IJ SOLN
100.0000 ug | Freq: Once | INTRAMUSCULAR | Status: AC
  Administered 2020-04-19: 100 ug via INTRAVENOUS

## 2020-04-19 MED ORDER — LIDOCAINE 2% (20 MG/ML) 5 ML SYRINGE
INTRAMUSCULAR | Status: AC
  Filled 2020-04-19: qty 5

## 2020-04-19 MED ORDER — ONDANSETRON HCL 4 MG/2ML IJ SOLN
INTRAMUSCULAR | Status: DC | PRN
  Administered 2020-04-19: 4 mg via INTRAVENOUS

## 2020-04-19 MED ORDER — MIDAZOLAM HCL 2 MG/2ML IJ SOLN
2.0000 mg | Freq: Once | INTRAMUSCULAR | Status: AC
  Administered 2020-04-19: 2 mg via INTRAVENOUS

## 2020-04-19 MED ORDER — ONDANSETRON HCL 4 MG/2ML IJ SOLN
INTRAMUSCULAR | Status: AC
  Filled 2020-04-19: qty 2

## 2020-04-19 MED ORDER — EPHEDRINE SULFATE 50 MG/ML IJ SOLN
INTRAMUSCULAR | Status: DC | PRN
  Administered 2020-04-19 (×3): 10 mg via INTRAVENOUS

## 2020-04-19 MED ORDER — HYDROMORPHONE HCL 1 MG/ML IJ SOLN
INTRAMUSCULAR | Status: AC
Start: 2020-04-19 — End: ?
  Filled 2020-04-19: qty 0.5

## 2020-04-19 MED ORDER — FENTANYL CITRATE (PF) 100 MCG/2ML IJ SOLN
INTRAMUSCULAR | Status: DC | PRN
Start: 2020-04-19 — End: 2020-04-19
  Administered 2020-04-19 (×6): 50 ug via INTRAVENOUS

## 2020-04-19 MED ORDER — DEXAMETHASONE SODIUM PHOSPHATE 10 MG/ML IJ SOLN
INTRAMUSCULAR | Status: AC
  Filled 2020-04-19: qty 1

## 2020-04-19 MED ORDER — DEXAMETHASONE SODIUM PHOSPHATE 10 MG/ML IJ SOLN
INTRAMUSCULAR | Status: DC | PRN
  Administered 2020-04-19: 10 mg via INTRAVENOUS

## 2020-04-19 MED ORDER — 0.9 % SODIUM CHLORIDE (POUR BTL) OPTIME
TOPICAL | Status: DC | PRN
  Administered 2020-04-19: 4000 mL

## 2020-04-19 SURGICAL SUPPLY — 94 items
ANCHOR SUT BIO SW 4.75X19.1 (Anchor) ×6 IMPLANT
BANDAGE ESMARK 6X9 LF (GAUZE/BANDAGES/DRESSINGS) ×2 IMPLANT
BLADE HEX COATED 2.75 (ELECTRODE) ×3 IMPLANT
BLADE SURG 10 STRL SS (BLADE) ×3 IMPLANT
BLADE SURG 15 STRL LF DISP TIS (BLADE) ×4 IMPLANT
BLADE SURG 15 STRL SS (BLADE) ×2
BNDG COHESIVE 4X5 TAN STRL (GAUZE/BANDAGES/DRESSINGS) ×3 IMPLANT
BNDG ELASTIC 4X5.8 VLCR STR LF (GAUZE/BANDAGES/DRESSINGS) ×6 IMPLANT
BNDG ELASTIC 6X5.8 VLCR STR LF (GAUZE/BANDAGES/DRESSINGS) ×6 IMPLANT
BNDG ESMARK 6X9 LF (GAUZE/BANDAGES/DRESSINGS) ×3
BNDG GAUZE ELAST 4 BULKY (GAUZE/BANDAGES/DRESSINGS) ×3 IMPLANT
BRUSH SCRUB EZ PLAIN DRY (MISCELLANEOUS) ×3 IMPLANT
CANISTER SUCT 1200ML W/VALVE (MISCELLANEOUS) IMPLANT
CLSR STERI-STRIP ANTIMIC 1/2X4 (GAUZE/BANDAGES/DRESSINGS) IMPLANT
COVER WAND RF STERILE (DRAPES) IMPLANT
CUFF TOURN SGL QUICK 24 (TOURNIQUET CUFF)
CUFF TOURN SGL QUICK 34 (TOURNIQUET CUFF) ×2
CUFF TRNQT CYL 24X4X16.5-23 (TOURNIQUET CUFF) IMPLANT
CUFF TRNQT CYL 34X4.125X (TOURNIQUET CUFF) ×4 IMPLANT
DECANTER SPIKE VIAL GLASS SM (MISCELLANEOUS) IMPLANT
DRAPE EXTREMITY T 121X128X90 (DISPOSABLE) IMPLANT
DRAPE HALF SHEET 70X43 (DRAPES) IMPLANT
DRAPE INCISE IOBAN 66X45 STRL (DRAPES) ×6 IMPLANT
DRAPE SURG 17X23 STRL (DRAPES) IMPLANT
DRAPE U-SHAPE 47X51 STRL (DRAPES) ×6 IMPLANT
DRSG AQUACEL AG ADV 3.5X10 (GAUZE/BANDAGES/DRESSINGS) ×6 IMPLANT
DURAPREP 26ML APPLICATOR (WOUND CARE) ×12 IMPLANT
ELECT REM PT RETURN 9FT ADLT (ELECTROSURGICAL) ×3
ELECTRODE REM PT RTRN 9FT ADLT (ELECTROSURGICAL) ×2 IMPLANT
GAUZE SPONGE 4X4 12PLY STRL (GAUZE/BANDAGES/DRESSINGS) ×6 IMPLANT
GAUZE XEROFORM 1X8 LF (GAUZE/BANDAGES/DRESSINGS) ×6 IMPLANT
GLOVE BIOGEL PI IND STRL 7.0 (GLOVE) ×10 IMPLANT
GLOVE BIOGEL PI INDICATOR 7.0 (GLOVE) ×5
GLOVE ECLIPSE 6.5 STRL STRAW (GLOVE) ×9 IMPLANT
GLOVE ECLIPSE 7.0 STRL STRAW (GLOVE) ×6 IMPLANT
GLOVE SKINSENSE NS SZ7.5 (GLOVE) ×1
GLOVE SKINSENSE STRL SZ7.5 (GLOVE) ×2 IMPLANT
GLOVE SURG SYN 7.5  E (GLOVE) ×4
GLOVE SURG SYN 7.5 E (GLOVE) ×8 IMPLANT
GOWN STRL REIN XL XLG (GOWN DISPOSABLE) ×3 IMPLANT
GOWN STRL REUS W/ TWL LRG LVL3 (GOWN DISPOSABLE) ×2 IMPLANT
GOWN STRL REUS W/ TWL XL LVL3 (GOWN DISPOSABLE) ×2 IMPLANT
GOWN STRL REUS W/TWL LRG LVL3 (GOWN DISPOSABLE) ×1
GOWN STRL REUS W/TWL XL LVL3 (GOWN DISPOSABLE) ×1
IMMOBILIZER KNEE 22 UNIV (SOFTGOODS) IMPLANT
IMMOBILIZER KNEE 24 THIGH 36 (MISCELLANEOUS) IMPLANT
IMMOBILIZER KNEE 24 UNIV (MISCELLANEOUS)
KNEE WRAP E Z 3 GEL PACK (MISCELLANEOUS) ×3 IMPLANT
MANIFOLD NEPTUNE II (INSTRUMENTS) ×3 IMPLANT
NS IRRIG 1000ML POUR BTL (IV SOLUTION) ×15 IMPLANT
PACK ARTHROSCOPY DSU (CUSTOM PROCEDURE TRAY) ×3 IMPLANT
PACK BASIN DAY SURGERY FS (CUSTOM PROCEDURE TRAY) ×3 IMPLANT
PAD CAST 4YDX4 CTTN HI CHSV (CAST SUPPLIES) ×2 IMPLANT
PADDING CAST COTTON 4X4 STRL (CAST SUPPLIES) ×1
PADDING CAST COTTON 6X4 STRL (CAST SUPPLIES) IMPLANT
PADDING CAST SYN 6 (CAST SUPPLIES)
PADDING CAST SYNTHETIC 4 (CAST SUPPLIES)
PADDING CAST SYNTHETIC 4X4 STR (CAST SUPPLIES) IMPLANT
PADDING CAST SYNTHETIC 6X4 NS (CAST SUPPLIES) IMPLANT
PENCIL SMOKE EVACUATOR (MISCELLANEOUS) ×3 IMPLANT
RETRIEVER SUT HEWSON (MISCELLANEOUS) ×3 IMPLANT
SLEEVE SCD COMPRESS KNEE MED (MISCELLANEOUS) IMPLANT
SPONGE LAP 18X18 RF (DISPOSABLE) ×9 IMPLANT
STAPLER VISISTAT (STAPLE) ×6 IMPLANT
STOCKINETTE IMPERVIOUS LG (DRAPES) ×3 IMPLANT
SUCTION FRAZIER HANDLE 10FR (MISCELLANEOUS) ×1
SUCTION TUBE FRAZIER 10FR DISP (MISCELLANEOUS) ×2 IMPLANT
SUT ETHILON 2 0 FS 18 (SUTURE) IMPLANT
SUT ETHILON 3 0 PS 1 (SUTURE) IMPLANT
SUT FIBERWIRE #2 38 T-5 BLUE (SUTURE) ×6
SUT FIBERWIRE #5 38 CONV NDL (SUTURE)
SUT MNCRL AB 3-0 PS2 18 (SUTURE) ×3 IMPLANT
SUT VIC AB 0 CT1 18XCR BRD 8 (SUTURE) IMPLANT
SUT VIC AB 0 CT1 27 (SUTURE) ×2
SUT VIC AB 0 CT1 27XBRD ANBCTR (SUTURE) ×4 IMPLANT
SUT VIC AB 0 CT1 8-18 (SUTURE)
SUT VIC AB 2-0 CT1 27 (SUTURE) ×7
SUT VIC AB 2-0 CT1 TAPERPNT 27 (SUTURE) ×14 IMPLANT
SUT VIC AB 2-0 SH 27 (SUTURE)
SUT VIC AB 2-0 SH 27XBRD (SUTURE) IMPLANT
SUTURE FIBERWR #2 38 T-5 BLUE (SUTURE) ×4 IMPLANT
SUTURE FIBERWR #5 38 CONV NDL (SUTURE) IMPLANT
SUTURE TAPE 1.3 40 TPR END (SUTURE) ×8 IMPLANT
SUTURETAPE 1.3 40 TPR END (SUTURE) ×12
SYR BULB EAR ULCER 3OZ GRN STR (SYRINGE) IMPLANT
SYS INTERNAL BRACE KNEE (Miscellaneous) ×3 IMPLANT
SYSTEM INTERNAL BRACE KNEE (Miscellaneous) ×2 IMPLANT
TOWEL GREEN STERILE FF (TOWEL DISPOSABLE) ×9 IMPLANT
TRAY DSU PREP LF (CUSTOM PROCEDURE TRAY) ×3 IMPLANT
TRAY FOL W/BAG SLVR 16FR STRL (SET/KITS/TRAYS/PACK) ×2 IMPLANT
TRAY FOLEY W/BAG SLVR 16FR LF (SET/KITS/TRAYS/PACK) ×1
TUBE CONNECTING 20X1/4 (TUBING) ×3 IMPLANT
UNDERPAD 30X36 HEAVY ABSORB (UNDERPADS AND DIAPERS) ×3 IMPLANT
YANKAUER SUCT BULB TIP NO VENT (SUCTIONS) ×3 IMPLANT

## 2020-04-19 NOTE — H&P (Signed)
PREOPERATIVE H&P  Chief Complaint: left quadriceps tendon tear, right patella tendon avulsion/tear  HPI: Logan Mccarthy is a 59 y.o. male who presents for surgical treatment of left quadriceps tendon tear, right patella tendon avulsion/tear.  He denies any changes in medical history.  Past Medical History:  Diagnosis Date  . Avulsion of right patellar tendon   . Back pain   . Hx pulmonary embolism 2016   took xarelto x1 yr  . Hypertension   . Ruptured, tendon, quadriceps, left, initial encounter   . Sleep apnea    does not use CPAP   Past Surgical History:  Procedure Laterality Date  . Heel spurs    . KNEE SURGERY Right   . Plantar fascitis     Social History   Socioeconomic History  . Marital status: Single    Spouse name: Not on file  . Number of children: 9  . Years of education: Not on file  . Highest education level: Not on file  Occupational History  . Occupation: DJ  Tobacco Use  . Smoking status: Never Smoker  . Smokeless tobacco: Never Used  Substance and Sexual Activity  . Alcohol use: No  . Drug use: No  . Sexual activity: Yes    Birth control/protection: Condom  Other Topics Concern  . Not on file  Social History Narrative   Lives in [place].  Comes in to see his wife and three youngest children.     Social Determinants of Health   Financial Resource Strain:   . Difficulty of Paying Living Expenses: Not on file  Food Insecurity:   . Worried About Programme researcher, broadcasting/film/video in the Last Year: Not on file  . Ran Out of Food in the Last Year: Not on file  Transportation Needs:   . Lack of Transportation (Medical): Not on file  . Lack of Transportation (Non-Medical): Not on file  Physical Activity:   . Days of Exercise per Week: Not on file  . Minutes of Exercise per Session: Not on file  Stress:   . Feeling of Stress : Not on file  Social Connections:   . Frequency of Communication with Friends and Family: Not on file  . Frequency of Social Gatherings  with Friends and Family: Not on file  . Attends Religious Services: Not on file  . Active Member of Clubs or Organizations: Not on file  . Attends Banker Meetings: Not on file  . Marital Status: Not on file   Family History  Problem Relation Age of Onset  . Hypertension Mother    No Known Allergies Prior to Admission medications   Medication Sig Start Date End Date Taking? Authorizing Provider  amLODipine (NORVASC) 10 MG tablet Take 10 mg by mouth daily.   Yes [provider]  aspirin EC 325 MG tablet Take 1 tablet (325 mg total) by mouth daily. 03/21/20  Yes Benjiman Core, MD  HYDROcodone-acetaminophen (NORCO) 5-325 MG tablet Take 1-2 tablets by mouth 2 (two) times daily as needed. 04/04/20  Yes Cristie Hem, PA-C  losartan (COZAAR) 50 MG tablet Take 50 mg by mouth daily.   Yes [provider]  Oxycodone HCl 20 MG TABS Take 20 mg by mouth 3 (three) times daily as needed (for pain).   Yes [provider]  rivaroxaban (XARELTO) 20 MG TABS tablet Take 1 tablet (20 mg total) by mouth daily with supper. Start on 07/14/15 07/14/15 03/14/19  Maretta Bees, MD  Positive ROS: All other systems have been reviewed and were otherwise negative with the exception of those mentioned in the HPI and as above.  Physical Exam: General: Alert, no acute distress Cardiovascular: No pedal edema Respiratory: No cyanosis, no use of accessory musculature GI: abdomen soft Skin: No lesions in the area of chief complaint Neurologic: Sensation intact distally Psychiatric: Patient is competent for consent with normal mood and affect Lymphatic: no lymphedema  MUSCULOSKELETAL: exam stable  Assessment: left quadriceps tendon tear, right patella tendon avulsion/tear  Plan: Plan for Procedure(s): REPAIR LEFT QUADRICEPS TENDON RIGHT PATELLA TENDON REPAIR  The risks benefits and alternatives were discussed with the patient including but not limited to the  risks of nonoperative treatment, versus surgical intervention including infection, bleeding, nerve injury,  blood clots, cardiopulmonary complications, morbidity, mortality, among others, and they were willing to proceed.   Preoperative templating of the joint replacement has been completed, documented, and submitted to the Operating Room personnel in order to optimize intra-operative equipment management.   Glee Arvin, MD 04/19/2020 7:20 AM

## 2020-04-19 NOTE — Anesthesia Postprocedure Evaluation (Signed)
Anesthesia Post Note  Patient: Investment banker, corporate  Procedure(s) Performed: REPAIR LEFT QUADRICEPS TENDON (Left Knee) RIGHT PATELLA TENDON REPAIR (Right Knee)     Patient location during evaluation: PACU Anesthesia Type: General Level of consciousness: awake Pain management: pain level controlled Vital Signs Assessment: post-procedure vital signs reviewed and stable Respiratory status: spontaneous breathing Cardiovascular status: stable Postop Assessment: no apparent nausea or vomiting Anesthetic complications: no   No complications documented.  Last Vitals:  Vitals:   04/19/20 1515 04/19/20 1530  BP: 131/86 134/78  Pulse: 78 83  Resp: 16 16  Temp:    SpO2: 100% 97%    Last Pain:  Vitals:   04/19/20 1530  TempSrc:   PainSc: 7                  Ataya Murdy

## 2020-04-19 NOTE — Op Note (Signed)
Date of Surgery: 04/19/2020  INDICATIONS: Mr. Vowels is a 59 y.o.-year-old male with a left quadriceps rupture and right patella fracture.  The patient did consent to the procedure after discussion of the risks and benefits.  PREOPERATIVE DIAGNOSIS:  1.  Left quadriceps rupture 2.  Right patella avulsion fracture and patellar rupture  POSTOPERATIVE DIAGNOSIS: Same.  PROCEDURE:  1.  Repair of left quadriceps tendon to patella 2.  Repair of left MPFL to patella 3.  Partial patellectomy of right patella fracture with advancement of patella tendon  SURGEON: N. Glee Arvin, M.D.  ASSIST: Starlyn Skeans [place], [place]; necessary for the timely completion of procedure and due to complexity of procedure.  ANESTHESIA:  general  IV FLUIDS AND URINE: See anesthesia.  ESTIMATED BLOOD LOSS: 150 mL.  IMPLANTS: Arthrex 4.75 mm swivel lock x 4  DRAINS: None  COMPLICATIONS: see description of procedure.  DESCRIPTION OF PROCEDURE: The patient was brought to the operating room.  The patient had been signed prior to the procedure and this was documented. The patient had the anesthesia placed by the anesthesiologist.  A time-out was performed to confirm that this was the correct patient, site, side and location. The patient did receive antibiotics prior to the incision and was re-dosed during the procedure as needed at indicated intervals.  A tourniquet was placed on each thigh.  The patient had the both operative lower extremities prepped and draped in the standard surgical fashion.    We first began with the left side.  A longitudinal incision was made directly anterior to the patella and quadricep.  Full-thickness flaps were elevated.  Hemostasis was obtained.  There was a large amount of scar tissue which had to be debrided away sharply until I was able to reveal normal anatomy proximally and then I worked distally to find the ruptured tendon stump.  His VMO and medial retinaculum had also been  ruptured off of the patella.  I found the MPFL which was also completely ruptured.  The bony surface of the patella was prepared using a rondure and the bone was excoriated back to bleeding bone.  I then ran to #2 fiber tapes in a locking Krakw fashion into the quadriceps tendon producing 4 parallel strands.  The 2 medial strands were anchored into the medial third of the patella while the 2 lateral strands were anchored into the lateral third of the patella.  There was good approximation of the quadriceps stump to the superior pole of the patella and the bony surface.  The MPFL was identified and this was repaired back to the patella using a #2 FiberWire suture that was passed through the periosteum.  The rest of the medial retinaculum was also repair with interrupted #2 FiberWire.  The surgical wound was then thoroughly irrigated and a layered closure was performed.  Sterile dressings were applied.  We then turned our attention to the right leg.  Another incision was made directly anterior to the patella and the proximal tibia.  Full-thickness flaps were elevated.  Scar tissue was debrided away and the normal tissue was encountered distally and then traced proximally.  The small avulsion fracture of the patella was not amenable to internal fixation therefore a partial patellectomy was performed for that piece and the tendon end was prepared as well as the distal pole of the patella which was excoriated back to bleeding bone.  We then used to #2 fiber tapes and a locking Krakw fashion into the patellar tendon producing 4  parallel strands.  Again the 2 medial strands were anchored into the medial third of the patella while the lateral 2 strands were anchored into the lateral third of the patella giving good approximation of the patella tendon to the inferior pole the patella.  The surgical wound was then thoroughly irrigated and closed in layered fashion.  Sterile dressings were applied.  Bledsoe braces were  placed on both legs with a range of motion of 0 to 20 degrees.  Patient tolerated the procedure well had no me complications.  POSTOPERATIVE PLAN: Patient can be weight-bear as tolerated and Bledsoe braces and a rolling Barbeau.  He is to start on Xarelto postoperatively to prevent DVTs.  Mayra Reel, MD 2:30 PM

## 2020-04-19 NOTE — Anesthesia Preprocedure Evaluation (Addendum)
Anesthesia Evaluation  Patient identified by MRN, date of birth, ID band Patient awake    Reviewed: Allergy & Precautions, NPO status , Patient's Chart, lab work & pertinent test results  Airway Mallampati: II  TM Distance: >3 FB     Dental   Pulmonary sleep apnea ,    breath sounds clear to auscultation       Cardiovascular hypertension,  Rhythm:Regular Rate:Normal     Neuro/Psych    GI/Hepatic negative GI ROS, Neg liver ROS,   Endo/Other    Renal/GU Renal disease     Musculoskeletal   Abdominal   Peds  Hematology   Anesthesia Other Findings   Reproductive/Obstetrics                             Anesthesia Physical Anesthesia Plan  ASA: III  Anesthesia Plan: General   Post-op Pain Management:    Induction: Intravenous  PONV Risk Score and Plan: 2 and Ondansetron, Dexamethasone and Midazolam  Airway Management Planned: Oral ETT and LMA  Additional Equipment:   Intra-op Plan:   Post-operative Plan: Extubation in OR  Informed Consent: I have reviewed the patients History and Physical, chart, labs and discussed the procedure including the risks, benefits and alternatives for the proposed anesthesia with the patient or authorized representative who has indicated his/her understanding and acceptance.     Dental advisory given  Plan Discussed with: CRNA and Anesthesiologist  Anesthesia Plan Comments:         Anesthesia Quick Evaluation

## 2020-04-19 NOTE — Anesthesia Procedure Notes (Signed)
Procedure Name: LMA Insertion Date/Time: 04/19/2020 12:31 PM Performed by: Thornell Mule, CRNA Pre-anesthesia Checklist: Patient identified, Emergency Drugs available, Suction available and Patient being monitored Patient Re-evaluated:Patient Re-evaluated prior to induction Oxygen Delivery Method: Circle system utilized Preoxygenation: Pre-oxygenation with 100% oxygen Induction Type: IV induction LMA: LMA inserted LMA Size: 4.0 Number of attempts: 1 Placement Confirmation: positive ETCO2 and breath sounds checked- equal and bilateral Tube secured with: Tape Dental Injury: Teeth and Oropharynx as per pre-operative assessment

## 2020-04-19 NOTE — Discharge Instructions (Signed)
   Postoperative instructions:  Weightbearing instructions: as tolerated in the braces  Dressing instructions: Keep your dressing and/or splint clean and dry at all times.  It will be removed at your first post-operative appointment.  Your stitches and/or staples will be removed at this visit.  Incision instructions:  Do not soak your incision for 3 weeks after surgery.  If the incision gets wet, pat dry and do not scrub the incision.  Pain control:  You have been given a prescription to be taken as directed for post-operative pain control.  In addition, elevate the operative extremity above the heart at all times to prevent swelling and throbbing pain.  Take over-the-counter Colace, 100mg  by mouth twice a day while taking narcotic pain medications to help prevent constipation.  Follow up appointments: 1) 14 days for suture removal and wound check. 2) Dr. as scheduled.   -------------------------------------------------------------------------------------------------------------  After Surgery Pain Control:  After your surgery, post-surgical discomfort or pain is likely. This discomfort can last several days to a few weeks. At certain times of the day your discomfort may be more intense.  Did you receive a nerve block?  A nerve block can provide pain relief for one hour to two days after your surgery. As long as the nerve block is working, you will experience little or no sensation in the area the surgeon operated on.  As the nerve block wears off, you will begin to experience pain or discomfort. It is very important that you begin taking your prescribed pain medication before the nerve block fully wears off. Treating your pain at the first sign of the block wearing off will ensure your pain is better controlled and more tolerable when full-sensation returns. Do not wait until the pain is intolerable, as the medicine will be less effective. It is better to treat pain in advance than to try  and catch up.  General Anesthesia:  If you did not receive a nerve block during your surgery, you will need to start taking your pain medication shortly after your surgery and should continue to do so as prescribed by your surgeon.  Pain Medication:  Most commonly we prescribe Vicodin and Percocet for post-operative pain. Both of these medications contain a combination of acetaminophen (Tylenol) and a narcotic to help control pain.   It takes between 30 and 45 minutes before pain medication starts to work. It is important to take your medication before your pain level gets too intense.   Nausea is a common side effect of many pain medications. You will want to eat something before taking your pain medicine to help prevent nausea.   If you are taking a prescription pain medication that contains acetaminophen, we recommend that you do not take additional over the counter acetaminophen (Tylenol).  Other pain relieving options:   Using a cold pack to ice the affected area a few times a day (15 to 20 minutes at a time) can help to relieve pain, reduce swelling and bruising.   Elevation of the affected area can also help to reduce pain and swelling.

## 2020-04-19 NOTE — Anesthesia Procedure Notes (Signed)
Anesthesia Regional Block: Adductor canal block   Pre-Anesthetic Checklist: ,, timeout performed,, Correct Site,, Correct Procedure,, site marked,,,,,,  Laterality: Left and Right  Prep: chloraprep       Needles:  Injection technique: Single-shot  Needle Type: Echogenic Stimulator Needle          Additional Needles:   Procedures: Doppler guided,,,, ultrasound used (permanent image in chart),,,,  Narrative:  Start time: 04/19/2020 11:55 AM End time: 04/19/2020 12:15 PM Injection made incrementally with aspirations every 5 mL.  Performed by: Personally  Anesthesiologist: Dorris Singh, MD

## 2020-04-19 NOTE — Progress Notes (Signed)
Assisted Dr Greenwith right, ultrasound guided, adductor canal block. Side rails up, monitors on throughout procedure. See vital signs in flow sheet. Tolerated Procedure well.  

## 2020-04-19 NOTE — Progress Notes (Signed)
Assisted Dr. Green with left, ultrasound guided, adductor canal block. Side rails up, monitors on throughout procedure. See vital signs in flow sheet. Tolerated Procedure well.  

## 2020-04-19 NOTE — Transfer of Care (Signed)
Immediate Anesthesia Transfer of Care Note  Patient: Logan Mccarthy  Procedure(s) Performed: REPAIR LEFT QUADRICEPS TENDON (Left Knee) RIGHT PATELLA TENDON REPAIR (Right Knee)  Patient Location: PACU  Anesthesia Type:General and Regional  Level of Consciousness: drowsy, patient cooperative and responds to stimulation  Airway & Oxygen Therapy: Patient Spontanous Breathing and Patient connected to face mask oxygen  Post-op Assessment: Report given to RN and Post -op Vital signs reviewed and stable  Post vital signs: Reviewed and stable  Last Vitals:  Vitals Value Taken Time  BP    Temp    Pulse 81 04/19/20 1507  Resp    SpO2 96 % 04/19/20 1507  Vitals shown include unvalidated device data.  Last Pain:  Vitals:   04/19/20 1045  TempSrc: Oral  PainSc: 0-No pain      Patients Stated Pain Goal: 3 (04/19/20 1045)  Complications: No complications documented.

## 2020-04-23 ENCOUNTER — Encounter (HOSPITAL_BASED_OUTPATIENT_CLINIC_OR_DEPARTMENT_OTHER): Payer: Self-pay | Admitting: Orthopaedic Surgery

## 2020-04-26 ENCOUNTER — Telehealth: Payer: Self-pay | Admitting: Orthopaedic Surgery

## 2020-04-26 ENCOUNTER — Other Ambulatory Visit: Payer: Self-pay | Admitting: Orthopaedic Surgery

## 2020-04-26 MED ORDER — OXYCODONE HCL 20 MG PO TABS
20.0000 mg | ORAL_TABLET | Freq: Three times a day (TID) | ORAL | 0 refills | Status: AC | PRN
Start: 2020-04-26 — End: ?

## 2020-04-26 MED ORDER — TIZANIDINE HCL 4 MG PO TABS: 4.0000 mg | ORAL_TABLET | Freq: Three times a day (TID) | ORAL | 0 refills | Status: DC | PRN

## 2020-04-26 NOTE — Telephone Encounter (Signed)
There is nothing stronger we can send in.  I did send in some more oxycodone and added Zanaflex as a stronger muscle relaxer to try.

## 2020-04-26 NOTE — Telephone Encounter (Signed)
Patient aware.

## 2020-04-26 NOTE — Telephone Encounter (Signed)
Please advise since Addison Bailey out of office Left quad tendon repair on 04/19/20

## 2020-04-26 NOTE — Telephone Encounter (Signed)
Patient called advised the Oxycodone is not helping with the pain. Patient asked if he can get something a little stronger? Patient uses [facility] on [address] and [address]. The number to contact patient is [phone]

## 2020-05-02 ENCOUNTER — Telehealth: Payer: Self-pay

## 2020-05-02 ENCOUNTER — Telehealth: Payer: Self-pay | Admitting: Physician Assistant

## 2020-05-02 MED ORDER — HYDROCODONE-ACETAMINOPHEN 5-325 MG PO TABS
1.0000 | ORAL_TABLET | Freq: Two times a day (BID) | ORAL | 0 refills | Status: DC | PRN
Start: 2020-05-02 — End: 2020-05-20

## 2020-05-02 NOTE — Telephone Encounter (Signed)
Patient called he is requesting prescription for oxycodone he stated he is out and is in pain call back:[phone]

## 2020-05-02 NOTE — Telephone Encounter (Signed)
done

## 2020-05-02 NOTE — Telephone Encounter (Signed)
Patient called asked if the Rx for Oxycodone can be canceled because your daughter picked up the Rx in [place]. Patient asked if he can get a Rx for Hydrocodone called into the pharmacy instead. Patient said he will have something for pain until his medicine is sent by his daughter. The number to contact patient if necessary is [phone]

## 2020-05-03 ENCOUNTER — Other Ambulatory Visit: Payer: Self-pay | Admitting: Physician Assistant

## 2020-05-03 ENCOUNTER — Ambulatory Visit (INDEPENDENT_AMBULATORY_CARE_PROVIDER_SITE_OTHER): Payer: Self-pay | Admitting: Orthopaedic Surgery

## 2020-05-03 ENCOUNTER — Encounter: Payer: Self-pay | Admitting: Orthopaedic Surgery

## 2020-05-03 VITALS — Ht 69.0 in | Wt 255.0 lb

## 2020-05-03 DIAGNOSIS — S82041A Displaced comminuted fracture of right patella, initial encounter for closed fracture: Secondary | ICD-10-CM

## 2020-05-03 DIAGNOSIS — S76112A Strain of left quadriceps muscle, fascia and tendon, initial encounter: Secondary | ICD-10-CM

## 2020-05-03 MED ORDER — RIVAROXABAN 10 MG PO TABS
10.0000 mg | ORAL_TABLET | Freq: Every day | ORAL | 0 refills | Status: DC
Start: 2020-05-03 — End: 2020-05-20

## 2020-05-03 NOTE — Telephone Encounter (Signed)
Called patient. No answer. Left message on voicemail that Hydrocodone has been sent to pharmacy.

## 2020-05-03 NOTE — Telephone Encounter (Signed)
Called and left message that Hydrocodone was sent in yesterday by Dr.Xu, as patient requested.

## 2020-05-03 NOTE — Progress Notes (Signed)
   Post-Op Visit Note   Patient: Logan Mccarthy           Date of Birth: 1961/02/09           MRN: [id number] Visit Date: 05/03/2020 PCP: System, Provider Not In   Assessment & Plan:  Chief Complaint:  Chief Complaint  Patient presents with  . Left Leg - Routine Post Op    Left quadriceps tendon repair 04/19/2020  . Right Knee - Routine Post Op    Right patellar tendon repair 04/19/2020   Visit Diagnoses:  1. Quadriceps tendon rupture, left, initial encounter   2. Displaced comminuted fracture of right patella, initial encounter for closed fracture     Plan: Logan Mccarthy is 2 weeks status post left quadriceps tendon repair and right partial patellectomy me for patella fracture.  He is overall doing well.  He is compliant with Xarelto.  He has been compliant with the braces.  Staples were removed today and Steri-Strips applied.  He will need to continue to ambulate with the braces and with crutches and a Mamula.  Restrictions were discussed today.  He will need to continue to take Xarelto for DVT prophylaxis given history of DVT and PE.  We will send in a referral for home health physical therapy.  Recheck in 4 weeks.  Follow-Up Instructions: Return in about 4 weeks (around 05/31/2020).   Orders:  No orders of the defined types were placed in this encounter.  No orders of the defined types were placed in this encounter.   Imaging: No results found.  PMFS History: Patient Active Problem List   Diagnosis Date Noted  . Quadriceps tendon rupture, left, initial encounter 05/03/2020  . Rupture, tendon, quadriceps, left, initial encounter 04/19/2020  . Displaced comminuted fracture of right patella, initial encounter for closed fracture 04/19/2020  . Pulmonary embolism without acute cor pulmonale (HCC)   . Elevated troponin 06/22/2015  . Chest pain 06/22/2015  . SOB (shortness of breath) 06/22/2015  . AKI (acute kidney injury) (HCC) 06/22/2015  . Essential hypertension   . Back pain   .  Sleep apnea    Past Medical History:  Diagnosis Date  . Avulsion of right patellar tendon   . Back pain   . Hx pulmonary embolism 2016   took xarelto x1 yr  . Hypertension   . Ruptured, tendon, quadriceps, left, initial encounter   . Sleep apnea    does not use CPAP    Family History  Problem Relation Age of Onset  . Hypertension Mother     Past Surgical History:  Procedure Laterality Date  . Heel spurs    . KNEE SURGERY Right   . PATELLAR TENDON REPAIR Right 04/19/2020   Procedure: RIGHT PATELLA TENDON REPAIR;  Surgeon: Tarry Kos, MD;  Location: [facility];  Service: Orthopedics;  Laterality: Right;  . Plantar fascitis    . QUADRICEPS TENDON REPAIR Left 04/19/2020   Procedure: REPAIR LEFT QUADRICEPS TENDON;  Surgeon: Tarry Kos, MD;  Location:  SURGERY CENTER;  Service: Orthopedics;  Laterality: Left;   Social History   Occupational History  . Occupation: DJ  Tobacco Use  . Smoking status: Never Smoker  . Smokeless tobacco: Never Used  Substance and Sexual Activity  . Alcohol use: No  . Drug use: No  . Sexual activity: Yes    Birth control/protection: Condom

## 2020-05-03 NOTE — Telephone Encounter (Signed)
Xu sent in

## 2020-05-03 NOTE — Addendum Note (Signed)
Addended by: Mayra Reel on: 05/03/2020 10:04 AM   Modules accepted: Orders

## 2020-05-08 ENCOUNTER — Telehealth: Payer: Self-pay | Admitting: Orthopaedic Surgery

## 2020-05-08 NOTE — Telephone Encounter (Signed)
Patient called.   He is concerned because he has not heard from his referral to have home PT   Call back: [phone]

## 2020-05-09 ENCOUNTER — Telehealth: Payer: Self-pay

## 2020-05-09 ENCOUNTER — Telehealth: Payer: Self-pay | Admitting: Orthopaedic Surgery

## 2020-05-09 NOTE — Telephone Encounter (Signed)
Patient called in wanting to get ref for pt says he is ready to start pt

## 2020-05-09 NOTE — Telephone Encounter (Signed)
Per [employer] email:   "Aram Beecham. This patient doesnt have insurance. We can still see him, he would just need to pay out of pocket. The charges are $145 per visit.  Just let me know"     My response today:    "I tried calling patient this morning no answer. It's up to the patient if he wants to pay out of pocket."     Dr Roda Shutters: If patient does not want to do HHPT what would you recommend?

## 2020-05-09 NOTE — Telephone Encounter (Signed)
See other msg

## 2020-05-09 NOTE — Telephone Encounter (Signed)
Forrest, Erlene Quan, RMA  Albertina Parr, RMA Please see message below. This is what it would cost for outpatient PT eval and treat of quad tendon. HHPT was going to 145$ per visit see below.       Previous Messages   ----- Message -----  From: Ellwood Sayers D  Sent: 05/09/2020 10:07 AM EDT  To: Rodena Medin, RMA   The PT EVAL will be anywhere between $300-$400. Each visit after that depends on what is done during the visit. Could be between $100-200 each visit.

## 2020-05-09 NOTE — Telephone Encounter (Signed)
Patient called back. He would like to start PT. His call back number is [phone]

## 2020-05-09 NOTE — Telephone Encounter (Signed)
Called patient no answer. LMOM to return our call. ? ?

## 2020-05-09 NOTE — Telephone Encounter (Signed)
I called patient. It went to VM. LMOM to return my call need to advise what he would like to do. HHPT or Outpatient PT.  See other msg with cost details.

## 2020-05-09 NOTE — Telephone Encounter (Signed)
This pt is a Roda Shutters pt that just recently had a quad tendon repair. He does not have insurance and wanted to know what the out of pocket expense would be for him to set up a visit? HHPT was going to charge 145$ per session. Do you have any information I could give the pt?

## 2020-05-09 NOTE — Telephone Encounter (Signed)
I have emailed Dorene Sorrow (HHPT) and asked him to call patient to discuss options. Patient states he has an attorney and that the pool place where he had the injury will be paying for everything.

## 2020-05-09 NOTE — Telephone Encounter (Signed)
Called patient. No answer. LMOM. Just need to advise on message below.

## 2020-05-09 NOTE — Telephone Encounter (Signed)
How about outpatient PT

## 2020-05-13 ENCOUNTER — Telehealth: Payer: Self-pay | Admitting: Orthopaedic Surgery

## 2020-05-13 ENCOUNTER — Other Ambulatory Visit: Payer: Self-pay

## 2020-05-13 DIAGNOSIS — S76112A Strain of left quadriceps muscle, fascia and tendon, initial encounter: Secondary | ICD-10-CM

## 2020-05-13 DIAGNOSIS — S82041A Displaced comminuted fracture of right patella, initial encounter for closed fracture: Secondary | ICD-10-CM

## 2020-05-13 DIAGNOSIS — S86891A Other injury of other muscle(s) and tendon(s) at lower leg level, right leg, initial encounter: Secondary | ICD-10-CM

## 2020-05-13 NOTE — Telephone Encounter (Signed)
Received call from pt. He will need records to be sent to his doctor in [place]. He will sign release form when he comes in for P.T. appt on 9/29.

## 2020-05-15 ENCOUNTER — Ambulatory Visit (INDEPENDENT_AMBULATORY_CARE_PROVIDER_SITE_OTHER): Payer: Self-pay | Admitting: Physical Therapy

## 2020-05-15 ENCOUNTER — Encounter: Payer: Self-pay | Admitting: Physical Therapy

## 2020-05-15 ENCOUNTER — Other Ambulatory Visit: Payer: Self-pay

## 2020-05-15 DIAGNOSIS — R2689 Other abnormalities of gait and mobility: Secondary | ICD-10-CM

## 2020-05-15 DIAGNOSIS — M6281 Muscle weakness (generalized): Secondary | ICD-10-CM

## 2020-05-15 DIAGNOSIS — M25561 Pain in right knee: Secondary | ICD-10-CM

## 2020-05-15 DIAGNOSIS — M25661 Stiffness of right knee, not elsewhere classified: Secondary | ICD-10-CM

## 2020-05-15 DIAGNOSIS — R2681 Unsteadiness on feet: Secondary | ICD-10-CM

## 2020-05-15 DIAGNOSIS — M25562 Pain in left knee: Secondary | ICD-10-CM

## 2020-05-15 DIAGNOSIS — M25662 Stiffness of left knee, not elsewhere classified: Secondary | ICD-10-CM

## 2020-05-15 NOTE — Therapy (Signed)
[facility] [address] [place], [place], [id number] Phone: [phone]   Fax:  [phone]  Physical Therapy Evaluation  Patient Details  Name: Logan RumpfDwayne Mccarthy MRN: [id number] Date of Birth: 08/08/1961 Referring Provider (PT): Tarry KosXu, Naiping M, MD   Encounter Date: 05/15/2020   PT End of Session - 05/15/20 1117    Visit Number 1    Number of Visits 20    Date for PT Re-Evaluation 08/07/20    Authorization Type Self-Pay    PT Start Time 0938    PT Stop Time 1012    PT Time Calculation (min) 34 min    Activity Tolerance Patient tolerated treatment well    Behavior During [facility] for tasks assessed/performed           Past Medical History:  Diagnosis Date  . Avulsion of right patellar tendon   . Back pain   . Hx pulmonary embolism 2016   took xarelto x1 yr  . Hypertension   . Ruptured, tendon, quadriceps, left, initial encounter   . Sleep apnea    does not use CPAP    Past Surgical History:  Procedure Laterality Date  . Heel spurs    . KNEE SURGERY Right   . PATELLAR TENDON REPAIR Right 04/19/2020   Procedure: RIGHT PATELLA TENDON REPAIR;  Surgeon: Tarry KosXu, Naiping M, MD;  Location: [facility];  Service: Orthopedics;  Laterality: Right;  . Plantar fascitis    . QUADRICEPS TENDON REPAIR Left 04/19/2020   Procedure: REPAIR LEFT QUADRICEPS TENDON;  Surgeon: Tarry KosXu, Naiping M, MD;  Location: [facility];  Service: Orthopedics;  Laterality: Left;    There were no vitals filed for this visit.    Subjective Assessment - 05/15/20 0943    Subjective Pt is a 59 y/o male who presents to OPPT s/p Lt quad tendon rupture and Rt partial patellectomy on 04/19/20.  Pt reports he was trying to dive off the diving board and the board didn't spring resulting in the injury.  Initial injury occured on 03/21/20.    Pertinent History PE, SOB, HTN, Rt quad tendon repair    Limitations Standing;Walking;Sitting    Patient Stated Goals be  able to return to regular function    Currently in Pain? Yes    Pain Score 0-No pain   up to 5-6/10   Pain Location Knee    Pain Orientation Right;Left    Pain Descriptors / Indicators Sharp;Aching    Pain Type Acute pain;Surgical pain    Pain Onset 1 to 4 weeks ago    Pain Frequency Intermittent    Aggravating Factors  occasional turning when up walking    Pain Relieving Factors meds              [facility] - 05/15/20 0948      Assessment   Medical Diagnosis status post left quadriceps tendon repair and right partial patellectomy for patella fracture    Referring Provider (PT) Tarry KosXu, Naiping M, MD    Onset Date/Surgical Date 04/19/20    Next MD Visit 05/31/20    Prior Therapy n/a      Precautions   Precautions Fall    Required Braces or Orthoses Other Brace/Splint    Other Brace/Splint bil bledsoe braces - locked into full extension for ambulation      Restrictions   Weight Bearing Restrictions No      Balance Screen   Has the patient fallen in the past 6 months  Yes    How many times? 2    Has the patient had a decrease in activity level because of a fear of falling?  Yes    Is the patient reluctant to leave their home because of a fear of falling?  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    Leisure DJ, basketball; goes to [employer]      Cognition   Overall Cognitive Status  Within Functional Limits for tasks assessed      ROM / Strength   AROM / PROM / Strength AROM;PROM;Strength      AROM   AROM Assessment Site Knee    Right/Left Knee Right;Left    Right Knee Extension 0    Right Knee Flexion 38    Left Knee Extension 0    Left Knee Flexion 36      PROM   PROM Assessment Site --    Right/Left Knee --      Strength   Overall Strength Comments deferred due to post - op status; has good quad activation isolated, ~ 5 deg extension lag bil with SLR      Palpation   Patella mobility deferred      Transfers   Comments mod I with elevated mat table      Ambulation/Gait   Ambulation/Gait Yes    Ambulation/Gait Assistance 6: Modified independent (Device/Increase time)    Ambulation Distance (Feet) 100 Feet    Assistive device Crutches    Gait Pattern Right circumduction;Left circumduction;Decreased hip/knee flexion - right;Decreased hip/knee flexion - left    Gait Comments pt amb in bil bledsoe braces locked into extension                      Objective measurements completed on examination: See above findings.       [facility] Adult PT Treatment/Exercise - 05/15/20 0948      Exercises   Exercises Other Exercises    Other Exercises  see pt instructions - reviewed and pt performed 5 reps each of supine exercises                  PT Education - 05/15/20 1117    Education Details HEP    Person(s) Educated Patient    Methods Explanation;Demonstration;Handout    Comprehension Verbalized understanding;Returned demonstration;Need further instruction            PT Short Term Goals - 05/15/20 1129      PT SHORT TERM GOAL #1   Title independent with initial HEP    Status New    Target Date 06/12/20      PT SHORT TERM GOAL #2   Title improve bil knee flexion to at least 60 deg for improved function  Target Date 06/12/20             PT Long Term Goals - 05/15/20 1130      PT LONG TERM GOAL #1   Title independent  with advanced HEP    Status New    Target Date 08/07/20      PT LONG TERM GOAL #2   Title improve bil knee AROM 0-120 for improved function and mobility    Status New    Target Date 08/07/20      PT LONG TERM GOAL #3   Title amb without AD or significant deviations independently for improved function    Status New    Target Date 08/07/20      PT LONG TERM GOAL #4   Title report pain < 2/10 for improved function    Status New    Target Date 08/07/20      PT LONG TERM GOAL #5   Title demonstrate 5/5 bil knee strength for improved function    Status New    Target Date 08/07/20                  Plan - 05/15/20 1118    Clinical Impression Statement Pt is a 59 y/o male who presents to OPPT s/p fall from a diving board resulting in Lt quad tendon rupture and Rt patellar fx s/p Lt quad tendon repair and Rt partial patellectomy on 04/19/20.  Pt presents today amb with bil Bledsoe braces locked in full extension, and demonstrates decreased ROM and strength as well as gait abnormalities and post-op swelling affecting functional mobility.  Pt will benefit from PT to maximize function.    Personal Factors and Comorbidities Comorbidity 3+    Comorbidities PE, HTN, Rt quad tendon repair    Examination-Activity Limitations Bathing;Sit;Squat;Stairs;Stand;Dressing;Transfers;Hygiene/Grooming;Locomotion Level    Examination-Participation Restrictions Community Activity;Driving;Occupation    Stability/Clinical Decision Making Evolving/Moderate complexity    Clinical Decision Making Moderate    Rehab Potential Good    PT Frequency 2x / week   1x/wk x 3-4 wks, then 2x/wk x 8 wks   PT Duration 12 weeks    PT Treatment/Interventions ADLs/Self Care Home Management;Cryotherapy;Electrical Stimulation;Moist Heat;Balance training;Therapeutic exercise;Therapeutic activities;Stair training;Gait training;DME Instruction;Ultrasound;Neuromuscular re-education;Patient/family education;Orthotic  Fit/Training;Manual techniques;Vasopneumatic Device;Taping;Dry needling;Passive range of motion    PT Next Visit Plan review HEP, keep flexion in pain-free range, work on quad/hip strength    PT Home Exercise Plan Access Code: [email or web address]    Consulted and Agree with Plan of Care Patient           Patient will benefit from skilled therapeutic intervention in order to improve the following deficits and impairments:  Abnormal gait, Increased edema, Decreased knowledge of use of DME, Decreased strength, Pain, Decreased mobility, Decreased balance, Difficulty walking, Decreased range of motion  Visit Diagnosis: Acute pain of left knee - Plan: PT plan of care cert/re-cert  Acute pain of right knee - Plan: PT plan of care cert/re-cert  Stiffness of right knee, not elsewhere classified - Plan: PT plan of care cert/re-cert  Stiffness of left knee, not elsewhere classified - Plan: PT plan of care cert/re-cert  Other abnormalities of gait and mobility - Plan: PT plan of care cert/re-cert  Muscle weakness (generalized) - Plan: PT plan of care cert/re-cert  Unsteadiness on feet - Plan: PT plan of care cert/re-cert     Problem List Patient Active Problem List   Diagnosis Date Noted  . Quadriceps tendon rupture, left, initial encounter 05/03/2020  .  Rupture, tendon, quadriceps, left, initial encounter 04/19/2020  . Displaced comminuted fracture of right patella, initial encounter for closed fracture 04/19/2020  . Pulmonary embolism without acute cor pulmonale (HCC)   . Elevated troponin 06/22/2015  . Chest pain 06/22/2015  . SOB (shortness of breath) 06/22/2015  . AKI (acute kidney injury) (HCC) 06/22/2015  . Essential hypertension   . Back pain   . Sleep apnea       Clarita Crane, PT, DPT 05/15/20 11:35 AM     [facility] [address] [place], [place], [address] Phone: [phone]   Fax:  [phone]  Name: Logan Mccarthy MRN:  [id number] Date of Birth: 14-Sep-1960

## 2020-05-15 NOTE — Patient Instructions (Signed)
Access Code: [email or web address] URL: [email or web address] Date: 05/15/2020 Prepared by: Moshe Cipro  Exercises Supine Quad Set - 5 x daily - 7 x weekly - 3 sets - 10 reps - 5 sec hold Supine Heel Slide - 5 x daily - 7 x weekly - 1-2 sets - 10 reps Small Range Straight Leg Raise - 5 x daily - 7 x weekly - 1-2 sets - 10 reps Prone Quadriceps Set - 5 x daily - 7 x weekly - 3 sets - 10 reps - 5 sec hold

## 2020-05-20 ENCOUNTER — Telehealth: Payer: Self-pay | Admitting: Orthopaedic Surgery

## 2020-05-20 MED ORDER — RIVAROXABAN 10 MG PO TABS
10.0000 mg | ORAL_TABLET | Freq: Every day | ORAL | 0 refills | Status: DC
Start: 2020-05-20 — End: 2023-05-29

## 2020-05-20 MED ORDER — HYDROCODONE-ACETAMINOPHEN 5-325 MG PO TABS
1.0000 | ORAL_TABLET | Freq: Every day | ORAL | 0 refills | Status: DC | PRN
Start: 2020-05-20 — End: 2020-06-14

## 2020-05-20 NOTE — Telephone Encounter (Signed)
Patient called requesting a refill of hydrocodone and xarelto. Please send to pharmacy on file. Patient phone number is [phone].

## 2020-05-20 NOTE — Telephone Encounter (Signed)
done

## 2020-05-21 NOTE — Telephone Encounter (Signed)
Called patient no answer LMOM. Rx sent to the pharm.

## 2020-05-22 ENCOUNTER — Ambulatory Visit (INDEPENDENT_AMBULATORY_CARE_PROVIDER_SITE_OTHER): Payer: Self-pay | Admitting: Rehabilitative and Restorative Service Providers"

## 2020-05-22 ENCOUNTER — Other Ambulatory Visit: Payer: Self-pay

## 2020-05-22 ENCOUNTER — Encounter: Payer: Self-pay | Admitting: Rehabilitative and Restorative Service Providers"

## 2020-05-22 DIAGNOSIS — M25662 Stiffness of left knee, not elsewhere classified: Secondary | ICD-10-CM

## 2020-05-22 DIAGNOSIS — M25661 Stiffness of right knee, not elsewhere classified: Secondary | ICD-10-CM

## 2020-05-22 DIAGNOSIS — R262 Difficulty in walking, not elsewhere classified: Secondary | ICD-10-CM

## 2020-05-22 DIAGNOSIS — M6281 Muscle weakness (generalized): Secondary | ICD-10-CM

## 2020-05-22 DIAGNOSIS — M25562 Pain in left knee: Secondary | ICD-10-CM

## 2020-05-22 DIAGNOSIS — M25561 Pain in right knee: Secondary | ICD-10-CM

## 2020-05-22 NOTE — Patient Instructions (Signed)
Access Code: [email or web address] URL: [email or web address] Date: 05/22/2020 Prepared by: Pauletta Browns  Exercises Small Range Straight Leg Raise - 1 x daily - 7 x weekly - 5-10 sets - 5 reps - 3 secondsinutes hold Seated Knee Flexion AAROM - 3-5 x daily - 7 x weekly - 1 sets - 1 reps - 3 minutes hold

## 2020-05-22 NOTE — Therapy (Signed)
[facility] Therapy [address] [place], [place], [address] Phone: [phone]   Fax:  [phone]  Physical Therapy Treatment  Patient Details  Name: Logan Mccarthy MRN: [id number] Date of Birth: 1961/04/28 Referring Provider (PT): Tarry Kos, MD   Encounter Date: 05/22/2020   PT End of Session - 05/22/20 1341    Visit Number 2    Number of Visits 20    Date for PT Re-Evaluation 08/07/20    Authorization Type Self-Pay    PT Start Time 1018    PT Stop Time 1102    PT Time Calculation (min) 44 min    Activity Tolerance Patient tolerated treatment well;No increased pain    Behavior During Therapy WFL for tasks assessed/performed           Past Medical History:  Diagnosis Date  . Avulsion of right patellar tendon   . Back pain   . Hx pulmonary embolism 2016   took xarelto x1 yr  . Hypertension   . Ruptured, tendon, quadriceps, left, initial encounter   . Sleep apnea    does not use CPAP    Past Surgical History:  Procedure Laterality Date  . Heel spurs    . KNEE SURGERY Right   . PATELLAR TENDON REPAIR Right 04/19/2020   Procedure: RIGHT PATELLA TENDON REPAIR;  Surgeon: Tarry Kos, MD;  Location: [facility];  Service: Orthopedics;  Laterality: Right;  . Plantar fascitis    . QUADRICEPS TENDON REPAIR Left 04/19/2020   Procedure: REPAIR LEFT QUADRICEPS TENDON;  Surgeon: Tarry Kos, MD;  Location: [facility];  Service: Orthopedics;  Laterality: Left;    There were no vitals filed for this visit.   Subjective Assessment - 05/22/20 1335    Subjective Jantz reports early compliance with his HEP.    Pertinent History PE, SOB, HTN, Rt quad tendon repair    Limitations Standing;Walking;Sitting    Patient Stated Goals be able to return to regular function    Currently in Pain? No/denies    Pain Onset More than a month ago    Pain Frequency Intermittent    Aggravating Factors  Too much WB (in braces now  with crutches)    Pain Relieving Factors Rest                             [facility] Adult PT Treatment/Exercise - 05/22/20 0001      Exercises   Exercises Knee/Hip      Knee/Hip Exercises: Stretches   Other Knee/Hip Stretches Seated passive knee flexion 3 minutes    Other Knee/Hip Stretches Supine active knee flexion AROM 2 sets of 10  3 seconds      Knee/Hip Exercises: Seated   Long Arc Quad Strengthening;Both;3 sets;5 reps   Seated straight leg raises     Knee/Hip Exercises: Supine   Quad Sets Strengthening;Both;2 sets;10 reps   5 seconds                 PT Education - 05/22/20 1337    Education Details Reviewed and slightly modified HEP (seated SLR vs supine and added passive knee flexion-gravity assisted without overpressure).    Person(s) Educated Patient    Methods Explanation;Demonstration;Tactile cues;Verbal cues;Handout    Comprehension Tactile cues required;Verbalized understanding;Returned demonstration;Verbal cues required;Need further instruction            PT Short Term Goals - 05/22/20 1338  PT SHORT TERM GOAL #1   Title independent with initial HEP    Status On-going    Target Date 06/12/20      PT SHORT TERM GOAL #2   Title improve bil knee flexion to at least 60 deg for improved function    Status On-going    Target Date 06/12/20             PT Long Term Goals - 05/22/20 1338      PT LONG TERM GOAL #1   Title independent with advanced HEP    Status On-going      PT LONG TERM GOAL #2   Title improve bil knee AROM 0-120 for improved function and mobility    Status On-going      PT LONG TERM GOAL #3   Title amb without AD or significant deviations independently for improved function    Status On-going      PT LONG TERM GOAL #4   Title report pain < 2/10 for improved function    Status On-going      PT LONG TERM GOAL #5   Title demonstrate 5/5 bil knee strength for improved function    Status On-going                  Plan - 05/22/20 1342    Clinical Impression Statement Dewayne reports good early HEP compliance.  His effort with exercises is excellent and he reports compliance with wearing his knee braces at home.  We progressed straight leg raises to seated to focus more on quadriceps vs hip flexpors strengthening and added a passive gravity assisted knee flexion activity (sitting in a chair and allwing knees to slowly bend gravity assisted in a pain-free range).  Continue appropriate LE strength progressions with gradual pain-free return of flexion AROM.    Personal Factors and Comorbidities Comorbidity 3+    Comorbidities PE, HTN, Rt quad tendon repair    Examination-Activity Limitations Bathing;Sit;Squat;Stairs;Stand;Dressing;Transfers;Hygiene/Grooming;Locomotion Level    Examination-Participation Restrictions Community Activity;Driving;Occupation    Stability/Clinical Decision Making Evolving/Moderate complexity    Rehab Potential Good    PT Frequency 2x / week   1x/wk x 3-4 wks, then 2x/wk x 8 wks   PT Duration 12 weeks    PT Treatment/Interventions ADLs/Self Care Home Management;Cryotherapy;Electrical Stimulation;Moist Heat;Balance training;Therapeutic exercise;Therapeutic activities;Stair training;Gait training;DME Instruction;Ultrasound;Neuromuscular re-education;Patient/family education;Orthotic Fit/Training;Manual techniques;Vasopneumatic Device;Taping;Dry needling;Passive range of motion    PT Next Visit Plan review HEP, keep flexion in pain-free range, work on quad/hip strength    PT Home Exercise Plan Access Code: [email or web address].  Access Code: [email or web address]    Consulted and Agree with Plan of Care Patient           Patient will benefit from skilled therapeutic intervention in order to improve the following deficits and impairments:  Abnormal gait, Increased edema, Decreased knowledge of use of DME, Decreased strength, Pain, Decreased mobility, Decreased balance, Difficulty walking,  Decreased range of motion  Visit Diagnosis: Difficulty walking  Acute pain of left knee  Acute pain of right knee  Stiffness of left knee, not elsewhere classified  Stiffness of right knee, not elsewhere classified  Muscle weakness (generalized)     Problem List Patient Active Problem List   Diagnosis Date Noted  . Quadriceps tendon rupture, left, initial encounter 05/03/2020  . Rupture, tendon, quadriceps, left, initial encounter 04/19/2020  . Displaced comminuted fracture of right patella, initial encounter for closed fracture 04/19/2020  . Pulmonary embolism without acute cor pulmonale (HCC)   .  Elevated troponin 06/22/2015  . Chest pain 06/22/2015  . SOB (shortness of breath) 06/22/2015  . AKI (acute kidney injury) (HCC) 06/22/2015  . Essential hypertension   . Back pain   . Sleep apnea     Cherlyn Cushing PT, MPT 05/22/2020, 1:46 PM  [facility] Physical Therapy [address] [place], [place], [address] Phone: [phone]   Fax:  [phone]  Name: Karnell Vanderloop MRN: [id number] Date of Birth: [birth year]

## 2020-05-30 ENCOUNTER — Encounter: Payer: Self-pay | Admitting: Physical Therapy

## 2020-05-31 ENCOUNTER — Ambulatory Visit (INDEPENDENT_AMBULATORY_CARE_PROVIDER_SITE_OTHER): Payer: Self-pay | Admitting: Orthopaedic Surgery

## 2020-05-31 ENCOUNTER — Encounter: Payer: Self-pay | Admitting: Orthopaedic Surgery

## 2020-05-31 VITALS — Ht 69.0 in | Wt 255.0 lb

## 2020-05-31 DIAGNOSIS — S82041A Displaced comminuted fracture of right patella, initial encounter for closed fracture: Secondary | ICD-10-CM

## 2020-05-31 DIAGNOSIS — S76112A Strain of left quadriceps muscle, fascia and tendon, initial encounter: Secondary | ICD-10-CM

## 2020-05-31 MED ORDER — TRAMADOL HCL 50 MG PO TABS
50.0000 mg | ORAL_TABLET | Freq: Two times a day (BID) | ORAL | 0 refills | Status: AC | PRN
Start: 2020-05-31 — End: ?

## 2020-05-31 NOTE — Progress Notes (Signed)
Post-Op Visit Note   Patient: Logan Mccarthy           Date of Birth: [birth year]           MRN: [id number] Visit Date: 05/31/2020 PCP: System, Provider Not In   Assessment & Plan:  Chief Complaint:  Chief Complaint  Patient presents with  . Right Knee - Follow-up    Patellar tendon repair 04/19/2020  . Left Knee - Follow-up    Quadriceps tendon repair 04/19/2020   Visit Diagnoses:  1. Rupture, tendon, quadriceps, left, initial encounter   2. Displaced comminuted fracture of right patella, initial encounter for closed fracture     Plan: Patient is a very pleasant 59 year old gentleman who comes in today 6 weeks out left quadricep tendon repair and right patella tendon repair 04/19/2020.  He has been doing excellent.  He is walking with crutches wearing Bledsoe braces locked in full extension.  He has been formal physical therapy as well as working on home exercise program.  He has been taking Norco to help facilitate with progression physical therapy.  Examination of both knees reveals a fully healed surgical scar without complication.  Range of motion 0 to approximately 85 degrees both sides.  He is able to fully straight leg raise both sides.  He is neurovascular intact distally.  At this point, I have unlocked both braces from 0 to 90 degrees.  He will continue with physical therapy.  He may start to wean off his crutches.  He will follow up with [place] in 6 weeks time for recheck.  Call with concerns or questions in the meantime.  Follow-Up Instructions: Return in about 6 weeks (around 07/12/2020).   Orders:  No orders of the defined types were placed in this encounter.  No orders of the defined types were placed in this encounter.   Imaging: No new imaging  PMFS History: Patient Active Problem List   Diagnosis Date Noted  . Quadriceps tendon rupture, left, initial encounter 05/03/2020  . Rupture, tendon, quadriceps, left, initial encounter 04/19/2020  . Displaced comminuted  fracture of right patella, initial encounter for closed fracture 04/19/2020  . Pulmonary embolism without acute cor pulmonale (HCC)   . Elevated troponin 06/22/2015  . Chest pain 06/22/2015  . SOB (shortness of breath) 06/22/2015  . AKI (acute kidney injury) (HCC) 06/22/2015  . Essential hypertension   . Back pain   . Sleep apnea    Past Medical History:  Diagnosis Date  . Avulsion of right patellar tendon   . Back pain   . Hx pulmonary embolism 2016   took xarelto x1 yr  . Hypertension   . Ruptured, tendon, quadriceps, left, initial encounter   . Sleep apnea    does not use CPAP    Family History  Problem Relation Age of Onset  . Hypertension Mother     Past Surgical History:  Procedure Laterality Date  . Heel spurs    . KNEE SURGERY Right   . PATELLAR TENDON REPAIR Right 04/19/2020   Procedure: RIGHT PATELLA TENDON REPAIR;  Surgeon: Tarry Kos, MD;  Location: [facility];  Service: Orthopedics;  Laterality: Right;  . Plantar fascitis    . QUADRICEPS TENDON REPAIR Left 04/19/2020   Procedure: REPAIR LEFT QUADRICEPS TENDON;  Surgeon: Tarry Kos, MD;  Location:  [facility];  Service: Orthopedics;  Laterality: Left;   Social History   Occupational History  . Occupation: DJ  Tobacco Use  . Smoking  status: Never Smoker  . Smokeless tobacco: Never Used  Substance and Sexual Activity  . Alcohol use: No  . Drug use: No  . Sexual activity: Yes    Birth control/protection: Condom

## 2020-06-05 ENCOUNTER — Encounter: Payer: Self-pay | Admitting: Physical Therapy

## 2020-06-11 ENCOUNTER — Other Ambulatory Visit: Payer: Self-pay

## 2020-06-11 ENCOUNTER — Encounter: Payer: Self-pay | Admitting: Physical Therapy

## 2020-06-11 ENCOUNTER — Ambulatory Visit (INDEPENDENT_AMBULATORY_CARE_PROVIDER_SITE_OTHER): Payer: Self-pay | Admitting: Physical Therapy

## 2020-06-11 DIAGNOSIS — M25662 Stiffness of left knee, not elsewhere classified: Secondary | ICD-10-CM

## 2020-06-11 DIAGNOSIS — M6281 Muscle weakness (generalized): Secondary | ICD-10-CM

## 2020-06-11 DIAGNOSIS — R2681 Unsteadiness on feet: Secondary | ICD-10-CM

## 2020-06-11 DIAGNOSIS — M25561 Pain in right knee: Secondary | ICD-10-CM

## 2020-06-11 DIAGNOSIS — M25562 Pain in left knee: Secondary | ICD-10-CM

## 2020-06-11 DIAGNOSIS — R262 Difficulty in walking, not elsewhere classified: Secondary | ICD-10-CM

## 2020-06-11 DIAGNOSIS — R2689 Other abnormalities of gait and mobility: Secondary | ICD-10-CM

## 2020-06-11 DIAGNOSIS — M25661 Stiffness of right knee, not elsewhere classified: Secondary | ICD-10-CM

## 2020-06-11 NOTE — Patient Instructions (Signed)
Access Code: [email or web address] URL: [email or web address] Date: 06/11/2020 Prepared by: Vladimir Faster  Exercises Supine Quad Set - 3-5 x daily - 7 x weekly - 3 sets - 10 reps - 5 sec hold Small Range Straight Leg Raise - 3-5 x daily - 7 x weekly - 3 sets - 10 reps Supine Heel Slide - 5 x daily - 7 x weekly - 1-2 sets - 10 reps Prone Quadriceps Set - 5 x daily - 7 x weekly - 3 sets - 10 reps - 5 sec hold seated knee flexion slide with strap assist - 3 x daily - 7 x weekly - 1 sets - 5 reps - 30 seconds hold Supine Single Leg Hip and Knee Flexion ROM with Swiss Ball - 3 x daily - 7 x weekly - 3 sets - 10 reps - 5 seconds hold

## 2020-06-11 NOTE — Therapy (Signed)
[facility] Physical Therapy [address] [place], [place], [address] Phone: [phone]   Fax:  [phone]  Physical Therapy Treatment  Patient Details  Name: Logan Mccarthy MRN: [id number] Date of Birth: [birth year] Referring Provider (PT): Tarry Kos, MD   Encounter Date: 06/11/2020   PT End of Session - 06/11/20 1015    Visit Number 3    Number of Visits 20    Date for PT Re-Evaluation 08/07/20    Authorization Type Self-Pay    PT Start Time 1015    PT Stop Time 1100    PT Time Calculation (min) 45 min    Activity Tolerance Patient tolerated treatment well;No increased pain    Behavior During Therapy WFL for tasks assessed/performed           Past Medical History:  Diagnosis Date  . Avulsion of right patellar tendon   . Back pain   . Hx pulmonary embolism 2016   took xarelto x1 yr  . Hypertension   . Ruptured, tendon, quadriceps, left, initial encounter   . Sleep apnea    does not use CPAP    Past Surgical History:  Procedure Laterality Date  . Heel spurs    . KNEE SURGERY Right   . PATELLAR TENDON REPAIR Right 04/19/2020   Procedure: RIGHT PATELLA TENDON REPAIR;  Surgeon: Tarry Kos, MD;  Location: [facility];  Service: Orthopedics;  Laterality: Right;  . Plantar fascitis    . QUADRICEPS TENDON REPAIR Left 04/19/2020   Procedure: REPAIR LEFT QUADRICEPS TENDON;  Surgeon: Tarry Kos, MD;  Location: [facility];  Service: Orthopedics;  Laterality: Left;    There were no vitals filed for this visit.   Subjective Assessment - 06/11/20 1015    Subjective Dr. Roda Shutters on 10/15 reported At this point, I have unlocked both braces from 0 to 90 degrees.  He will continue with physical therapy.  He may start to wean off his crutches.    Pertinent History PE, SOB, HTN, Rt quad tendon repair    Limitations Standing;Walking;Sitting    Patient Stated Goals be able to return to regular function    Currently in Pain? Yes     Pain Score 5    left knee 5/10, right knee 3/10   Pain Location Knee    Pain Orientation Left;Right    Pain Descriptors / Indicators Aching;Sore    Pain Type Surgical pain;Acute pain    Pain Onset More than a month ago    Pain Frequency Constant    Aggravating Factors  waking up & getting out of bed    Pain Relieving Factors moving around                             [facility] Adult PT Treatment/Exercise - 06/11/20 1015      Ambulation/Gait   Ambulation/Gait Yes    Ambulation/Gait Assistance 5: Supervision   verbal cues, no balance deficits noted   Ambulation/Gait Assistance Details demo & verbal cues on step width and pelvic weight shift to stance limb.      Ambulation Distance (Feet) 100 Feet    Assistive device R Axillary Crutch    Ambulation Surface Level;Indoor      Exercises   Exercises Knee/Hip      Knee/Hip Exercises: Stretches   Passive Hamstring Stretch Right;Left;2 reps;20 seconds    Passive Hamstring Stretch Limitations supine with LE extended on leg press  with strap to DF     Other Knee/Hip Stretches Seated passive knee flexion 3 minutes weight shift & strap to increase range    Other Knee/Hip Stretches --      Knee/Hip Exercises: Machines for Strengthening   Total Gym Leg Press shuttle leg press BLEs 100# 15 reps 2 sets      Knee/Hip Exercises: Seated   Long Arc Quad Strengthening;Both;3 sets;5 reps   Seated straight leg raises     Knee/Hip Exercises: Supine   Quad Sets Strengthening;Both;2 sets;10 reps   5 seconds   Quad Sets Limitations verbal cues on muscle contraction    Heel Slides AAROM;Right;Left;10 reps    Heel Slides Limitations supine with foot on ball using strap for flexion & quad set in extension    Straight Leg Raises AROM;Strengthening;Right;Left;10 reps    Straight Leg Raises Limitations quad set & minimizing quad lag                    PT Short Term Goals - 05/22/20 1338      PT SHORT TERM GOAL #1   Title  independent with initial HEP    Status On-going    Target Date 06/12/20      PT SHORT TERM GOAL #2   Title improve bil knee flexion to at least 60 deg for improved function    Status On-going    Target Date 06/12/20             PT Long Term Goals - 05/22/20 1338      PT LONG TERM GOAL #1   Title independent with advanced HEP    Status On-going      PT LONG TERM GOAL #2   Title improve bil knee AROM 0-120 for improved function and mobility    Status On-going      PT LONG TERM GOAL #3   Title amb without AD or significant deviations independently for improved function    Status On-going      PT LONG TERM GOAL #4   Title report pain < 2/10 for improved function    Status On-going      PT LONG TERM GOAL #5   Title demonstrate 5/5 bil knee strength for improved function    Status On-going                 Plan - 06/11/20 1024    Clinical Impression Statement PT reviewed & updated HEP. Patient reports better understanding.  PT instructed in proper step width & pelvic weight shift to stance limb which improved his gait.    Personal Factors and Comorbidities Comorbidity 3+    Comorbidities PE, HTN, Rt quad tendon repair    Examination-Activity Limitations Bathing;Sit;Squat;Stairs;Stand;Dressing;Transfers;Hygiene/Grooming;Locomotion Level    Examination-Participation Restrictions Community Activity;Driving;Occupation    Stability/Clinical Decision Making Evolving/Moderate complexity    Rehab Potential Good    PT Frequency 2x / week   1x/wk x 3-4 wks, then 2x/wk x 8 wks   PT Duration 12 weeks    PT Treatment/Interventions ADLs/Self Care Home Management;Cryotherapy;Electrical Stimulation;Moist Heat;Balance training;Therapeutic exercise;Therapeutic activities;Stair training;Gait training;DME Instruction;Ultrasound;Neuromuscular re-education;Patient/family education;Orthotic Fit/Training;Manual techniques;Vasopneumatic Device;Taping;Dry needling;Passive range of motion    PT  Next Visit Plan keep flexion in pain-free range, work on [employer],    PT Home Exercise Plan Access Code: [email or web address]    Consulted and Agree with Plan of Care Patient           Patient will benefit from skilled therapeutic intervention in  order to improve the following deficits and impairments:  Abnormal gait, Increased edema, Decreased knowledge of use of DME, Decreased strength, Pain, Decreased mobility, Decreased balance, Difficulty walking, Decreased range of motion  Visit Diagnosis: Difficulty walking  Acute pain of left knee  Acute pain of right knee  Stiffness of left knee, not elsewhere classified  Stiffness of right knee, not elsewhere classified  Muscle weakness (generalized)  Other abnormalities of gait and mobility  Unsteadiness on feet     Problem List Patient Active Problem List   Diagnosis Date Noted  . Quadriceps tendon rupture, left, initial encounter 05/03/2020  . Rupture, tendon, quadriceps, left, initial encounter 04/19/2020  . Displaced comminuted fracture of right patella, initial encounter for closed fracture 04/19/2020  . Pulmonary embolism without acute cor pulmonale (HCC)   . Elevated troponin 06/22/2015  . Chest pain 06/22/2015  . SOB (shortness of breath) 06/22/2015  . AKI (acute kidney injury) (HCC) 06/22/2015  . Essential hypertension   . Back pain   . Sleep apnea     Vladimir Faster, PT, DPT 06/11/2020, 1:48 PM  [facility] Physical Therapy [address] [place], [place], [address] Phone: [phone]   Fax:  [phone]  Name: Logan Mccarthy MRN: [id number] Date of Birth: [birth year]

## 2020-06-14 ENCOUNTER — Other Ambulatory Visit: Payer: Self-pay | Admitting: Physician Assistant

## 2020-06-14 ENCOUNTER — Telehealth: Payer: Self-pay | Admitting: Orthopaedic Surgery

## 2020-06-14 MED ORDER — HYDROCODONE-ACETAMINOPHEN 5-325 MG PO TABS
1.0000 | ORAL_TABLET | Freq: Every day | ORAL | 0 refills | Status: DC | PRN
Start: 2020-06-14 — End: 2023-05-29

## 2020-06-14 NOTE — Telephone Encounter (Signed)
Sent in norco, but make him aware he needs to wean from this rx.  From ortho standpoint, he no longer needs blood thinner.  Needs to check with pcp from standpoint of previous PE

## 2020-06-14 NOTE — Telephone Encounter (Signed)
Patient called requesting refills of blood thinner medication and hydrocodone. Please send to [facility] on [address]. Patient phone number is [phone].

## 2020-06-14 NOTE — Telephone Encounter (Signed)
Patient aware.

## 2020-06-18 ENCOUNTER — Encounter: Payer: Self-pay | Admitting: Physical Therapy

## 2020-06-18 ENCOUNTER — Other Ambulatory Visit: Payer: Self-pay

## 2020-06-18 ENCOUNTER — Ambulatory Visit (INDEPENDENT_AMBULATORY_CARE_PROVIDER_SITE_OTHER): Payer: Self-pay | Admitting: Physical Therapy

## 2020-06-18 DIAGNOSIS — M25562 Pain in left knee: Secondary | ICD-10-CM

## 2020-06-18 DIAGNOSIS — M25662 Stiffness of left knee, not elsewhere classified: Secondary | ICD-10-CM

## 2020-06-18 DIAGNOSIS — M6281 Muscle weakness (generalized): Secondary | ICD-10-CM

## 2020-06-18 DIAGNOSIS — M25661 Stiffness of right knee, not elsewhere classified: Secondary | ICD-10-CM

## 2020-06-18 DIAGNOSIS — R262 Difficulty in walking, not elsewhere classified: Secondary | ICD-10-CM

## 2020-06-18 DIAGNOSIS — M25561 Pain in right knee: Secondary | ICD-10-CM

## 2020-06-18 DIAGNOSIS — R2681 Unsteadiness on feet: Secondary | ICD-10-CM

## 2020-06-18 DIAGNOSIS — R2689 Other abnormalities of gait and mobility: Secondary | ICD-10-CM

## 2020-06-18 NOTE — Therapy (Signed)
[facility] [address] [place], [place], [address] Phone: [phone]   Fax:  [phone]  Physical Therapy Treatment  Patient Details  Name: Logan Mccarthy MRN: [id number] Date of Birth: 12/27/1960 Referring Provider (PT): Leandrew Koyanagi, MD   Encounter Date: 06/18/2020   PT End of Session - 06/18/20 1111    Visit Number 4    Number of Visits 20    Date for PT Re-Evaluation 08/07/20    Authorization Type Self-Pay    PT Start Time 1105    PT Stop Time 1145    PT Time Calculation (min) 40 min    Activity Tolerance Patient tolerated treatment well;No increased pain    Behavior During Therapy WFL for tasks assessed/performed           Past Medical History:  Diagnosis Date  . Avulsion of right patellar tendon   . Back pain   . Hx pulmonary embolism 2016   took xarelto x1 yr  . Hypertension   . Ruptured, tendon, quadriceps, left, initial encounter   . Sleep apnea    does not use CPAP    Past Surgical History:  Procedure Laterality Date  . Heel spurs    . KNEE SURGERY Right   . PATELLAR TENDON REPAIR Right 04/19/2020   Procedure: RIGHT PATELLA TENDON REPAIR;  Surgeon: Leandrew Koyanagi, MD;  Location: [place];  Service: Orthopedics;  Laterality: Right;  . Plantar fascitis    . QUADRICEPS TENDON REPAIR Left 04/19/2020   Procedure: REPAIR LEFT QUADRICEPS TENDON;  Surgeon: Leandrew Koyanagi, MD;  Location: [place];  Service: Orthopedics;  Laterality: Left;    There were no vitals filed for this visit.   Subjective Assessment - 06/18/20 1105    Subjective He walks in house without knee braces.    Pertinent History PE, SOB, HTN, Rt quad tendon repair    Limitations Standing;Walking;Sitting    Patient Stated Goals be able to return to regular function    Currently in Pain? Yes    Pain Score 2     Pain Location Knee    Pain Orientation Left    Pain Descriptors / Indicators Aching    Pain Type Acute pain     Pain Onset More than a month ago    Pain Frequency Intermittent    Aggravating Factors  quad sets on left    Pain Relieving Factors relaxing or resting                             [facility] Adult PT Treatment/Exercise - 06/18/20 1106      Ambulation/Gait   Ambulation/Gait Yes    Ambulation/Gait Assistance 5: Supervision   verbal cues, no balance deficits noted   Ambulation Distance (Feet) 100 Feet    Assistive device R Axillary Crutch      Self-Care   Self-Care Other Self-Care Comments    Other Self-Care Comments  PT instructed in aligning orthotic knee with anatomical knee & make sure both medial & lateral joints are not locked.   PT demo using stockinette & verbal cues on cutting slits in non-stretch shorts at height of thigh straps on knee orthosis & feeding straps thru slits.  Pt verbalized understanding.       Exercises   Exercises Knee/Hip      Knee/Hip Exercises: Stretches   Passive Hamstring Stretch Right;Left;2 reps;20 seconds    Passive Hamstring Stretch Limitations  supine with LE extended on leg press with strap to DF     Other Knee/Hip Stretches --      Knee/Hip Exercises: Machines for Strengthening   Total Gym Leg Press shuttle leg press BLEs 100# 15 reps 3 sets      Knee/Hip Exercises: Standing   Forward Step Up Right;Left;Hand Hold: 1;Step Height: 4"   2 reps ea LE   Forward Step Up Limitations had to stop as Knee orthoses kept sliding down,  demo & verbal cues on technique including weight shift.     Step Down Right;Left;Hand Hold: 1;Step Height: 4"   2 reps ea LE   Step Down Limitations had to stop as Knee orthoses kept sliding down,  demo & verbal cues on technique including weight shift.       Knee/Hip Exercises: Seated   Long Arc Quad --      Knee/Hip Exercises: Supine   Quad Sets Strengthening;Both;2 sets;10 reps    Quad Sets Limitations verbal & tactile cues on muscle contraction    Heel Slides --    Heel Slides Limitations --     Straight Leg Raises AROM;Strengthening;Right;Left;10 reps;2 sets    Straight Leg Raises Limitations quad set & minimizing quad lag                    PT Short Term Goals - 06/18/20 1336      PT SHORT TERM GOAL #1   Title independent with initial HEP    Baseline MET 06/18/2020    Status Achieved    Target Date 06/12/20      PT SHORT TERM GOAL #2   Title improve bil knee flexion to at least 60 deg for improved function    Baseline MET 06/18/2020    Status Achieved    Target Date 06/12/20             PT Long Term Goals - 05/22/20 1338      PT LONG TERM GOAL #1   Title independent with advanced HEP    Status On-going      PT LONG TERM GOAL #2   Title improve bil knee AROM 0-120 for improved function and mobility    Status On-going      PT LONG TERM GOAL #3   Title amb without AD or significant deviations independently for improved function    Status On-going      PT LONG TERM GOAL #4   Title report pain < 2/10 for improved function    Status On-going      PT LONG TERM GOAL #5   Title demonstrate 5/5 bil knee strength for improved function    Status On-going                 Plan - 06/18/20 1111    Clinical Impression Statement PT attempted to progress to functional standing therapuetic exercises but orthoses would not stay up in proper location.  PT instructed pt in modification to pants / shorts that can assist and he appears to understand.    Personal Factors and Comorbidities Comorbidity 3+    Comorbidities PE, HTN, Rt quad tendon repair    Examination-Activity Limitations Bathing;Sit;Squat;Stairs;Stand;Dressing;Transfers;Hygiene/Grooming;Locomotion Level    Examination-Participation Restrictions Community Activity;Driving;Occupation    Stability/Clinical Decision Making Evolving/Moderate complexity    Rehab Potential Good    PT Frequency 2x / week   1x/wk x 3-4 wks, then 2x/wk x 8 wks   PT Duration 12 weeks  PT Treatment/Interventions  ADLs/Self Care Home Management;Cryotherapy;Electrical Stimulation;Moist Heat;Balance training;Therapeutic exercise;Therapeutic activities;Stair training;Gait training;DME Instruction;Ultrasound;Neuromuscular re-education;Patient/family education;Orthotic Fit/Training;Manual techniques;Vasopneumatic Device;Taping;Dry needling;Passive range of motion    PT Next Visit Plan keep flexion in pain-free range, work on quad/hip strength, progress to standing exercises.    PT Home Exercise Plan Access Code: [email or web address]    Consulted and Agree with Plan of Care Patient           Patient will benefit from skilled therapeutic intervention in order to improve the following deficits and impairments:  Abnormal gait, Increased edema, Decreased knowledge of use of DME, Decreased strength, Pain, Decreased mobility, Decreased balance, Difficulty walking, Decreased range of motion  Visit Diagnosis: Difficulty walking  Acute pain of left knee  Acute pain of right knee  Stiffness of left knee, not elsewhere classified  Stiffness of right knee, not elsewhere classified  Muscle weakness (generalized)  Other abnormalities of gait and mobility  Unsteadiness on feet     Problem List Patient Active Problem List   Diagnosis Date Noted  . Quadriceps tendon rupture, left, initial encounter 05/03/2020  . Rupture, tendon, quadriceps, left, initial encounter 04/19/2020  . Displaced comminuted fracture of right patella, initial encounter for closed fracture 04/19/2020  . Pulmonary embolism without acute cor pulmonale ([place])   . Elevated troponin 06/22/2015  . Chest pain 06/22/2015  . SOB (shortness of breath) 06/22/2015  . AKI (acute kidney injury) ([place]) 06/22/2015  . Essential hypertension   . Back pain   . Sleep apnea     Jamey Reas, PT, DPT 06/18/2020, 1:38 PM  [facility] Physical Therapy [address] [place], [place], [address] Phone: [phone]   Fax:  [phone]  Name:  Logan Mccarthy MRN: [id number] Date of Birth: Mar 30, 1961

## 2020-06-20 ENCOUNTER — Other Ambulatory Visit: Payer: Self-pay

## 2020-06-20 ENCOUNTER — Ambulatory Visit (INDEPENDENT_AMBULATORY_CARE_PROVIDER_SITE_OTHER): Payer: Self-pay | Admitting: Physical Therapy

## 2020-06-20 DIAGNOSIS — R262 Difficulty in walking, not elsewhere classified: Secondary | ICD-10-CM

## 2020-06-20 DIAGNOSIS — M25561 Pain in right knee: Secondary | ICD-10-CM

## 2020-06-20 DIAGNOSIS — R2689 Other abnormalities of gait and mobility: Secondary | ICD-10-CM

## 2020-06-20 DIAGNOSIS — M6281 Muscle weakness (generalized): Secondary | ICD-10-CM

## 2020-06-20 DIAGNOSIS — M25562 Pain in left knee: Secondary | ICD-10-CM

## 2020-06-20 DIAGNOSIS — M25662 Stiffness of left knee, not elsewhere classified: Secondary | ICD-10-CM

## 2020-06-20 DIAGNOSIS — R2681 Unsteadiness on feet: Secondary | ICD-10-CM

## 2020-06-20 DIAGNOSIS — M25661 Stiffness of right knee, not elsewhere classified: Secondary | ICD-10-CM

## 2020-06-20 NOTE — Therapy (Signed)
[place], [place], [address] Phone: [phone]   Fax:  [phone]  Physical Therapy Treatment  Patient Details  Name: Logan Mccarthy MRN: [id number] Date of Birth: 13-Jan-1961 Referring Provider (PT): Leandrew Koyanagi, MD   Encounter Date: 06/20/2020   PT End of Session - 06/20/20 1109    Visit Number 5    Number of Visits 20    Date for PT Re-Evaluation 08/07/20    Authorization Type Self-Pay    PT Start Time 0930    PT Stop Time 9381    PT Time Calculation (min) 45 min    Activity Tolerance Patient tolerated treatment well;No increased pain    Behavior During Therapy WFL for tasks assessed/performed           Past Medical History:  Diagnosis Date  . Avulsion of right patellar tendon   . Back pain   . Hx pulmonary embolism 2016   took xarelto x1 yr  . Hypertension   . Ruptured, tendon, quadriceps, left, initial encounter   . Sleep apnea    does not use CPAP    Past Surgical History:  Procedure Laterality Date  . Heel spurs    . KNEE SURGERY Right   . PATELLAR TENDON REPAIR Right 04/19/2020   Procedure: RIGHT PATELLA TENDON REPAIR;  Surgeon: Leandrew Koyanagi, MD;  Location: [facility];  Service: Orthopedics;  Laterality: Right;  . Plantar fascitis    . QUADRICEPS TENDON REPAIR Left 04/19/2020   Procedure: REPAIR LEFT QUADRICEPS TENDON;  Surgeon: Leandrew Koyanagi, MD;  Location: [place];  Service: Orthopedics;  Laterality: Left;    There were no vitals filed for this visit.   Subjective Assessment - 06/20/20 1106    Subjective he relays not much pain today, he has been working very hard on his HEP    Pertinent History PE, SOB, HTN, Rt quad tendon repair    Limitations Standing;Walking;Sitting    Patient Stated Goals be able to return to regular function    Pain Onset More than a month ago            [facility] Adult PT Treatment/Exercise - 06/20/20 0001      Knee/Hip Exercises:  Stretches   Knee: Self-Stretch Limitations tailgate stretch 10 sec X 10 reps bilat    Gastroc Stretch Both;3 reps;30 seconds      Knee/Hip Exercises: Aerobic   Nustep L5 X 15 min      Knee/Hip Exercises: Machines for Strengthening   Total Gym Leg Press shuttle leg press BLEs 125# 15 reps 3 sets      Knee/Hip Exercises: Standing   Lateral Step Up Right;Left;15 reps;Hand Hold: 1;Step Height: 4"    Forward Step Up Right;Left;Hand Hold: 1;Step Height: 4";15 reps      Knee/Hip Exercises: Seated   Sit to Sand 20 reps;without UE support   10 reps with airex pad, reomoved last 10 reps, slow eccentri            PT Short Term Goals - 06/18/20 1336      PT SHORT TERM GOAL #1   Title independent with initial HEP    Baseline MET 06/18/2020    Status Achieved    Target Date 06/12/20      PT SHORT TERM GOAL #2   Title improve bil knee flexion to at least 60 deg for improved function    Baseline MET 06/18/2020    Status Achieved  Target Date 06/12/20             PT Long Term Goals - 05/22/20 1338      PT LONG TERM GOAL #1   Title independent with advanced HEP    Status On-going      PT LONG TERM GOAL #2   Title improve bil knee AROM 0-120 for improved function and mobility    Status On-going      PT LONG TERM GOAL #3   Title amb without AD or significant deviations independently for improved function    Status On-going      PT LONG TERM GOAL #4   Title report pain < 2/10 for improved function    Status On-going      PT LONG TERM GOAL #5   Title demonstrate 5/5 bil knee strength for improved function    Status On-going                 Plan - 06/20/20 1110    Clinical Impression Statement He wore thicker pants and used ace wrap to keep brace more securely in place making it appropriate for standing closed chain strengthening today. Added sit to stands with slow eccentric lower, as well as step ups into his HEP for more strengtheing at home. Continue POC     Personal Factors and Comorbidities Comorbidity 3+    Comorbidities PE, HTN, Rt quad tendon repair    Examination-Activity Limitations Bathing;Sit;Squat;Stairs;Stand;Dressing;Transfers;Hygiene/Grooming;Locomotion Level    Examination-Participation Restrictions Community Activity;Driving;Occupation    Stability/Clinical Decision Making Evolving/Moderate complexity    Rehab Potential Good    PT Frequency 2x / week   1x/wk x 3-4 wks, then 2x/wk x 8 wks   PT Duration 12 weeks    PT Treatment/Interventions ADLs/Self Care Home Management;Cryotherapy;Electrical Stimulation;Moist Heat;Balance training;Therapeutic exercise;Therapeutic activities;Stair training;Gait training;DME Instruction;Ultrasound;Neuromuscular re-education;Patient/family education;Orthotic Fit/Training;Manual techniques;Vasopneumatic Device;Taping;Dry needling;Passive range of motion    PT Next Visit Plan keep flexion in pain-free range, work on quad/hip strength, progress to standing exercises.    PT Home Exercise Plan Access Code: [email or web address]    Consulted and Agree with Plan of Care Patient           Patient will benefit from skilled therapeutic intervention in order to improve the following deficits and impairments:  Abnormal gait, Increased edema, Decreased knowledge of use of DME, Decreased strength, Pain, Decreased mobility, Decreased balance, Difficulty walking, Decreased range of motion  Visit Diagnosis: Difficulty walking  Acute pain of left knee  Acute pain of right knee  Stiffness of left knee, not elsewhere classified  Stiffness of right knee, not elsewhere classified  Muscle weakness (generalized)  Other abnormalities of gait and mobility  Unsteadiness on feet     Problem List Patient Active Problem List   Diagnosis Date Noted  . Quadriceps tendon rupture, left, initial encounter 05/03/2020  . Rupture, tendon, quadriceps, left, initial encounter 04/19/2020  . Displaced comminuted fracture of right  patella, initial encounter for closed fracture 04/19/2020  . Pulmonary embolism without acute cor pulmonale ([place])   . Elevated troponin 06/22/2015  . Chest pain 06/22/2015  . SOB (shortness of breath) 06/22/2015  . AKI (acute kidney injury) ([place]) 06/22/2015  . Essential hypertension   . Back pain   . Sleep apnea     [name] 06/20/2020, 11:12 AM  [facility] Physical Therapy [address] [place], [place], [address] Phone: [phone]   Fax:  [phone]  Name: Logan Mccarthy MRN: [id number] Date of Birth: [birth year]

## 2020-06-24 ENCOUNTER — Encounter: Payer: Self-pay | Admitting: Physical Therapy

## 2020-06-26 ENCOUNTER — Encounter: Payer: Self-pay | Admitting: Physical Therapy

## 2020-06-26 ENCOUNTER — Other Ambulatory Visit: Payer: Self-pay

## 2020-06-26 ENCOUNTER — Ambulatory Visit (INDEPENDENT_AMBULATORY_CARE_PROVIDER_SITE_OTHER): Payer: Self-pay | Admitting: Physical Therapy

## 2020-06-26 DIAGNOSIS — M25662 Stiffness of left knee, not elsewhere classified: Secondary | ICD-10-CM

## 2020-06-26 DIAGNOSIS — M25661 Stiffness of right knee, not elsewhere classified: Secondary | ICD-10-CM

## 2020-06-26 DIAGNOSIS — R2689 Other abnormalities of gait and mobility: Secondary | ICD-10-CM

## 2020-06-26 DIAGNOSIS — M25561 Pain in right knee: Secondary | ICD-10-CM

## 2020-06-26 DIAGNOSIS — M25562 Pain in left knee: Secondary | ICD-10-CM

## 2020-06-26 DIAGNOSIS — M6281 Muscle weakness (generalized): Secondary | ICD-10-CM

## 2020-06-26 DIAGNOSIS — R262 Difficulty in walking, not elsewhere classified: Secondary | ICD-10-CM

## 2020-06-26 NOTE — Therapy (Signed)
[place], [place], [address] Phone: [phone]   Fax:  [phone]  Physical Therapy Treatment  Patient Details  Name: Logan Mccarthy MRN: [id number] Date of Birth: 1961/05/24 Referring Provider (PT): Leandrew Koyanagi, MD   Encounter Date: 06/26/2020    Past Medical History:  Diagnosis Date  . Avulsion of right patellar tendon   . Back pain   . Hx pulmonary embolism 2016   took xarelto x1 yr  . Hypertension   . Ruptured, tendon, quadriceps, left, initial encounter   . Sleep apnea    does not use CPAP    Past Surgical History:  Procedure Laterality Date  . Heel spurs    . KNEE SURGERY Right   . PATELLAR TENDON REPAIR Right 04/19/2020   Procedure: RIGHT PATELLA TENDON REPAIR;  Surgeon: Leandrew Koyanagi, MD;  Location: [place] [facility];  Service: Orthopedics;  Laterality: Right;  . Plantar fascitis    . QUADRICEPS TENDON REPAIR Left 04/19/2020   Procedure: REPAIR LEFT QUADRICEPS TENDON;  Surgeon: Leandrew Koyanagi, MD;  Location: [place] [facility];  Service: Orthopedics;  Laterality: Left;    There were no vitals filed for this visit.   Subjective Assessment - 06/26/20 1203    Subjective relays he is now walking some without the braces and without AD.    Pertinent History PE, SOB, HTN, Rt quad tendon repair    Limitations Standing;Walking;Sitting    Patient Stated Goals be able to return to regular function    Currently in Pain? Yes    Pain Score 1     Pain Location Knee    Pain Orientation Right;Left    Pain Onset More than a month ago            [facility] Adult PT Treatment/Exercise - 06/26/20 0001      Ambulation/Gait   Ambulation/Gait Yes    Ambulation/Gait Assistance 5: Supervision    Ambulation Distance (Feet) 100 Feet   X2   Assistive device None   and without braces     Knee/Hip Exercises: Stretches   Knee: Self-Stretch Limitations lunge stretch 10 sec X 10 bilat with foot on 6 inch step     Gastroc Stretch Both;3 reps;30 seconds      Knee/Hip Exercises: Aerobic   Nustep L7 X 10 min      Knee/Hip Exercises: Machines for Strengthening   Total Gym Leg Press shuttle leg press BLEs 125# 15 reps 3 sets      Knee/Hip Exercises: Standing   Forward Step Up Right;Left;Hand Hold: 4;96 reps;Step Height: 6"    Other Standing Knee Exercises march walking at counter top started with one UE support progressed to no UE support up/down  X4       Knee/Hip Exercises: Seated   Sit to Sand --   2 sets of 10 from chair, slow eccentrics                   PT Short Term Goals - 06/18/20 1336      PT SHORT TERM GOAL #1   Title independent with initial HEP    Baseline MET 06/18/2020    Status Achieved    Target Date 06/12/20      PT SHORT TERM GOAL #2   Title improve bil knee flexion to at least 60 deg for improved function    Baseline MET 06/18/2020    Status Achieved    Target Date 06/12/20  PT Long Term Goals - 05/22/20 1338      PT LONG TERM GOAL #1   Title independent with advanced HEP    Status On-going      PT LONG TERM GOAL #2   Title improve bil knee AROM 0-120 for improved function and mobility    Status On-going      PT LONG TERM GOAL #3   Title amb without AD or significant deviations independently for improved function    Status On-going      PT LONG TERM GOAL #4   Title report pain < 2/10 for improved function    Status On-going      PT LONG TERM GOAL #5   Title demonstrate 5/5 bil knee strength for improved function    Status On-going                 Plan - 06/26/20 1206    Clinical Impression Statement MD has not cleared him to be out of braces yet so he was advised that he needs to be very careful and that this is at his own discretion if he chooses not to wear them. He does have good quad control and balance in clinic walking on flat even surfaces however without the braces during sesison today. He was encouraged to wear  them at home still especially on uneven surfaces. Overall he is progressing with strength, ROM, and gait very well.    Personal Factors and Comorbidities Comorbidity 3+    Comorbidities PE, HTN, Rt quad tendon repair    Examination-Activity Limitations Bathing;Sit;Squat;Stairs;Stand;Dressing;Transfers;Hygiene/Grooming;Locomotion Level    Examination-Participation Restrictions Community Activity;Driving;Occupation    Stability/Clinical Decision Making Evolving/Moderate complexity    Rehab Potential Good    PT Frequency 2x / week   1x/wk x 3-4 wks, then 2x/wk x 8 wks   PT Duration 12 weeks    PT Treatment/Interventions ADLs/Self Care Home Management;Cryotherapy;Electrical Stimulation;Moist Heat;Balance training;Therapeutic exercise;Therapeutic activities;Stair training;Gait training;DME Instruction;Ultrasound;Neuromuscular re-education;Patient/family education;Orthotic Fit/Training;Manual techniques;Vasopneumatic Device;Taping;Dry needling;Passive range of motion    PT Next Visit Plan keep flexion in pain-free range, work on quad/hip strength progression and gait/balance    PT Home Exercise Plan Access Code: [email or web address]    Consulted and Agree with Plan of Care Patient           Patient will benefit from skilled therapeutic intervention in order to improve the following deficits and impairments:  Abnormal gait, Increased edema, Decreased knowledge of use of DME, Decreased strength, Pain, Decreased mobility, Decreased balance, Difficulty walking, Decreased range of motion  Visit Diagnosis: Difficulty walking  Acute pain of left knee  Acute pain of right knee  Stiffness of left knee, not elsewhere classified  Stiffness of right knee, not elsewhere classified  Muscle weakness (generalized)  Other abnormalities of gait and mobility     Problem List Patient Active Problem List   Diagnosis Date Noted  . Quadriceps tendon rupture, left, initial encounter 05/03/2020  . Rupture, tendon,  quadriceps, left, initial encounter 04/19/2020  . Displaced comminuted fracture of right patella, initial encounter for closed fracture 04/19/2020  . Pulmonary embolism without acute cor pulmonale ([place])   . Elevated troponin 06/22/2015  . Chest pain 06/22/2015  . SOB (shortness of breath) 06/22/2015  . AKI (acute kidney injury) ([place]) 06/22/2015  . Essential hypertension   . Back pain   . Sleep apnea     [name] 06/26/2020, 12:24 PM  [facility] Physical Therapy [address] [place], [place], [address] Phone: [phone]  Fax:  [phone]  Name: Logan Mccarthy MRN: [id number] Date of Birth: 1960-09-21

## 2020-07-01 ENCOUNTER — Ambulatory Visit (INDEPENDENT_AMBULATORY_CARE_PROVIDER_SITE_OTHER): Payer: Self-pay | Admitting: Physical Therapy

## 2020-07-01 ENCOUNTER — Encounter: Payer: Self-pay | Admitting: Physical Therapy

## 2020-07-01 ENCOUNTER — Other Ambulatory Visit: Payer: Self-pay

## 2020-07-01 DIAGNOSIS — M25562 Pain in left knee: Secondary | ICD-10-CM

## 2020-07-01 DIAGNOSIS — R2689 Other abnormalities of gait and mobility: Secondary | ICD-10-CM

## 2020-07-01 DIAGNOSIS — M6281 Muscle weakness (generalized): Secondary | ICD-10-CM

## 2020-07-01 DIAGNOSIS — R262 Difficulty in walking, not elsewhere classified: Secondary | ICD-10-CM

## 2020-07-01 DIAGNOSIS — R2681 Unsteadiness on feet: Secondary | ICD-10-CM

## 2020-07-01 DIAGNOSIS — M25661 Stiffness of right knee, not elsewhere classified: Secondary | ICD-10-CM

## 2020-07-01 DIAGNOSIS — M25561 Pain in right knee: Secondary | ICD-10-CM

## 2020-07-01 DIAGNOSIS — M25662 Stiffness of left knee, not elsewhere classified: Secondary | ICD-10-CM

## 2020-07-01 NOTE — Therapy (Signed)
[facility] Physical Therapy [address] [place], [place], [address] Phone: [phone]   Fax:  [phone]  Physical Therapy Treatment  Patient Details  Name: [place] MRN: [id number] Date of Birth: 1960-11-28 Referring Provider (PT): Leandrew Koyanagi, MD   Encounter Date: 07/01/2020   PT End of Session - 07/01/20 1105    Visit Number 7    Number of Visits 20    Date for PT Re-Evaluation 08/07/20    Authorization Type Self-Pay    PT Start Time 1019    PT Stop Time 1057    PT Time Calculation (min) 38 min    Activity Tolerance Patient tolerated treatment well;No increased pain    Behavior During Therapy WFL for tasks assessed/performed           Past Medical History:  Diagnosis Date  . Avulsion of right patellar tendon   . Back pain   . Hx pulmonary embolism 2016   took xarelto x1 yr  . Hypertension   . Ruptured, tendon, quadriceps, left, initial encounter   . Sleep apnea    does not use CPAP    Past Surgical History:  Procedure Laterality Date  . Heel spurs    . KNEE SURGERY Right   . PATELLAR TENDON REPAIR Right 04/19/2020   Procedure: RIGHT PATELLA TENDON REPAIR;  Surgeon: Leandrew Koyanagi, MD;  Location: [place];  Service: Orthopedics;  Laterality: Right;  . Plantar fascitis    . QUADRICEPS TENDON REPAIR Left 04/19/2020   Procedure: REPAIR LEFT QUADRICEPS TENDON;  Surgeon: Leandrew Koyanagi, MD;  Location: [place];  Service: Orthopedics;  Laterality: Left;    There were no vitals filed for this visit.   Subjective Assessment - 07/01/20 1020    Subjective doing well, has 5# ankle weights at home    Pertinent History PE, SOB, HTN, Rt quad tendon repair    Limitations Standing;Walking;Sitting    Patient Stated Goals be able to return to regular function    Currently in Pain? No/denies              [facility] PT Assessment - 07/01/20 1035      Assessment   Medical Diagnosis status post left quadriceps  tendon repair and right partial patellectomy for patella fracture    Referring Provider (PT) Leandrew Koyanagi, MD      AROM   Right Knee Extension 0    Right Knee Flexion 118    Left Knee Extension 0    Left Knee Flexion 116                         [facility] Adult PT Treatment/Exercise - 07/01/20 1023      Knee/Hip Exercises: Stretches   Quad Stretch Both;3 reps;30 seconds    Quad Stretch Limitations prone with strap      Knee/Hip Exercises: Aerobic   Nustep L7 X 10 min      Knee/Hip Exercises: Machines for Strengthening   Total Gym Leg Press shuttle leg press BLEs 143# 15 reps 3 sets      Knee/Hip Exercises: Supine   Bridges Both;20 reps    Bridges Limitations on green physioball                    PT Short Term Goals - 06/18/20 1336      PT SHORT TERM GOAL #1   Title independent with initial HEP    Baseline  MET 06/18/2020    Status Achieved    Target Date 06/12/20      PT SHORT TERM GOAL #2   Title improve bil knee flexion to at least 60 deg for improved function    Baseline MET 06/18/2020    Status Achieved    Target Date 06/12/20             PT Long Term Goals - 05/22/20 1338      PT LONG TERM GOAL #1   Title independent with advanced HEP    Status On-going      PT LONG TERM GOAL #2   Title improve bil knee AROM 0-120 for improved function and mobility    Status On-going      PT LONG TERM GOAL #3   Title amb without AD or significant deviations independently for improved function    Status On-going      PT LONG TERM GOAL #4   Title report pain < 2/10 for improved function    Status On-going      PT LONG TERM GOAL #5   Title demonstrate 5/5 bil knee strength for improved function    Status On-going                 Plan - 07/01/20 1105    Clinical Impression Statement Pt overall demonstrating great progress with PT, and significantly improved knee flexion bil, as well as progressing strengthening exercises with good  tolerance.  Will continue to benefit from PT to maximize function.    Personal Factors and Comorbidities Comorbidity 3+    Comorbidities PE, HTN, Rt quad tendon repair    Examination-Activity Limitations Bathing;Sit;Squat;Stairs;Stand;Dressing;Transfers;Hygiene/Grooming;Locomotion Level    Examination-Participation Restrictions Community Activity;Driving;Occupation    Stability/Clinical Decision Making Evolving/Moderate complexity    Rehab Potential Good    PT Frequency 2x / week   1x/wk x 3-4 wks, then 2x/wk x 8 wks   PT Duration 12 weeks    PT Treatment/Interventions ADLs/Self Care Home Management;Cryotherapy;Electrical Stimulation;Moist Heat;Balance training;Therapeutic exercise;Therapeutic activities;Stair training;Gait training;DME Instruction;Ultrasound;Neuromuscular re-education;Patient/family education;Orthotic Fit/Training;Manual techniques;Vasopneumatic Device;Taping;Dry needling;Passive range of motion    PT Next Visit Plan work on quad/hip strength progression and gait/balance; will need note for MD    PT Home Exercise Plan Access Code: [email or web address]    Consulted and Agree with Plan of Care Patient           Patient will benefit from skilled therapeutic intervention in order to improve the following deficits and impairments:  Abnormal gait, Increased edema, Decreased knowledge of use of DME, Decreased strength, Pain, Decreased mobility, Decreased balance, Difficulty walking, Decreased range of motion  Visit Diagnosis: Difficulty walking  Acute pain of left knee  Acute pain of right knee  Stiffness of left knee, not elsewhere classified  Stiffness of right knee, not elsewhere classified  Muscle weakness (generalized)  Other abnormalities of gait and mobility  Unsteadiness on feet     Problem List Patient Active Problem List   Diagnosis Date Noted  . Quadriceps tendon rupture, left, initial encounter 05/03/2020  . Rupture, tendon, quadriceps, left, initial encounter  04/19/2020  . Displaced comminuted fracture of right patella, initial encounter for closed fracture 04/19/2020  . Pulmonary embolism without acute cor pulmonale ([place])   . Elevated troponin 06/22/2015  . Chest pain 06/22/2015  . SOB (shortness of breath) 06/22/2015  . AKI (acute kidney injury) ([place]) 06/22/2015  . Essential hypertension   . Back pain   . Sleep apnea  Laureen Abrahams, PT, DPT 07/01/20 11:08 AM     [facility] Physical Therapy [address] [place], [place], [address] Phone: [phone]   Fax:  [phone]  Name: [place] MRN: [id number] Date of Birth: November 25, 1960

## 2020-07-03 ENCOUNTER — Encounter: Payer: Self-pay | Admitting: Physical Therapy

## 2020-07-03 ENCOUNTER — Ambulatory Visit (INDEPENDENT_AMBULATORY_CARE_PROVIDER_SITE_OTHER): Payer: Self-pay | Admitting: Physical Therapy

## 2020-07-03 ENCOUNTER — Other Ambulatory Visit: Payer: Self-pay

## 2020-07-03 DIAGNOSIS — R262 Difficulty in walking, not elsewhere classified: Secondary | ICD-10-CM

## 2020-07-03 DIAGNOSIS — M25662 Stiffness of left knee, not elsewhere classified: Secondary | ICD-10-CM

## 2020-07-03 DIAGNOSIS — M6281 Muscle weakness (generalized): Secondary | ICD-10-CM

## 2020-07-03 DIAGNOSIS — M25661 Stiffness of right knee, not elsewhere classified: Secondary | ICD-10-CM

## 2020-07-03 DIAGNOSIS — R2689 Other abnormalities of gait and mobility: Secondary | ICD-10-CM

## 2020-07-03 DIAGNOSIS — M25561 Pain in right knee: Secondary | ICD-10-CM

## 2020-07-03 DIAGNOSIS — M25562 Pain in left knee: Secondary | ICD-10-CM

## 2020-07-03 NOTE — Therapy (Signed)
[facility] Physical Therapy [address] [place], [place], [address] Phone: [phone]   Fax:  [phone]  Physical Therapy Treatment  Patient Details  Name: [place] MRN: [id number] Date of Birth: 02/02/1961 Referring Provider (PT): Leandrew Koyanagi, MD   Encounter Date: 07/03/2020   PT End of Session - 07/03/20 1137    Visit Number 8    Number of Visits 20    Date for PT Re-Evaluation 08/07/20    Authorization Type Self-Pay    PT Start Time 1100    PT Stop Time 1145    PT Time Calculation (min) 45 min    Activity Tolerance Patient tolerated treatment well;No increased pain    Behavior During Therapy WFL for tasks assessed/performed           Past Medical History:  Diagnosis Date  . Avulsion of right patellar tendon   . Back pain   . Hx pulmonary embolism 2016   took xarelto x1 yr  . Hypertension   . Ruptured, tendon, quadriceps, left, initial encounter   . Sleep apnea    does not use CPAP    Past Surgical History:  Procedure Laterality Date  . Heel spurs    . KNEE SURGERY Right   . PATELLAR TENDON REPAIR Right 04/19/2020   Procedure: RIGHT PATELLA TENDON REPAIR;  Surgeon: Leandrew Koyanagi, MD;  Location: [place];  Service: Orthopedics;  Laterality: Right;  . Plantar fascitis    . QUADRICEPS TENDON REPAIR Left 04/19/2020   Procedure: REPAIR LEFT QUADRICEPS TENDON;  Surgeon: Leandrew Koyanagi, MD;  Location: [place];  Service: Orthopedics;  Laterality: Left;    There were no vitals filed for this visit.   Subjective Assessment - 07/03/20 1124    Subjective he relays a little soreness about 3/10 overall in his knees, he says he is now walking without the braces just fine and feels he is ready for light duty type work.    Pertinent History PE, SOB, HTN, Rt quad tendon repair    Limitations Standing;Walking;Sitting    Patient Stated Goals be able to return to regular function    Pain Onset More than a month ago               [facility] PT Assessment - 07/03/20 0001      Assessment   Medical Diagnosis status post left quadriceps tendon repair and right partial patellectomy for patella fracture    Referring Provider (PT) Leandrew Koyanagi, MD    Onset Date/Surgical Date 04/19/20      AROM   Right Knee Extension 0    Right Knee Flexion 120    Left Knee Extension 0    Left Knee Flexion 123      PROM   Right Knee Flexion 130    Left Knee Flexion 130      Strength   Strength Assessment Site Knee;Hip    Right/Left Hip Right;Left    Right Hip Flexion 4+/5    Right Hip ABduction 4+/5    Left Hip ABduction 4+/5    Left Hip ADduction 4+/5    Right/Left Knee Right;Left    Right Knee Flexion 4+/5    Right Knee Extension 4+/5    Left Knee Flexion 4+/5    Left Knee Extension 4+/5                         [facility] Adult PT Treatment/Exercise - 07/03/20  0001      Knee/Hip Exercises: Stretches   Lobbyist Both;3 reps;30 seconds    Quad Stretch Limitations prone with strap    Knee: Self-Stretch Limitations lunge stretch 10 sec X 10 bilat with foot in chair      Knee/Hip Exercises: Aerobic   Recumbent Bike L7 first 8 min, then L10 last 2 minutes      Knee/Hip Exercises: Machines for Strengthening   Cybex Knee Extension 10 lbs bilat push 4 sets of 10    Cybex Knee Flexion 45 lbs bilat pull 3X10    Total Gym Leg Press shuttle leg press BLEs 150# 15 reps 3 sets      Knee/Hip Exercises: Standing   Forward Step Up Right;Left;Hand Hold: 1;Step Height: 6";2 sets;10 reps    Functional Squat Limitations with UE support X 15 reps    Other Standing Knee Exercises deadlift 10 lbs from floor X 15 reps      Knee/Hip Exercises: Seated   Sit to Sand 2 sets;10 reps;without UE support                    PT Short Term Goals - 06/18/20 1336      PT SHORT TERM GOAL #1   Title independent with initial HEP    Baseline MET 06/18/2020    Status Achieved    Target Date 06/12/20      PT  SHORT TERM GOAL #2   Title improve bil knee flexion to at least 60 deg for improved function    Baseline MET 06/18/2020    Status Achieved    Target Date 06/12/20             PT Long Term Goals - 07/03/20 1152      PT LONG TERM GOAL #1   Title independent with advanced HEP    Status On-going      PT LONG TERM GOAL #2   Title improve bil knee AROM 0-120 for improved function and mobility    Status Achieved      PT LONG TERM GOAL #3   Title amb without AD or significant deviations independently for improved function    Status Achieved      PT LONG TERM GOAL #4   Title report pain < 2/10 for improved function    Status On-going      PT LONG TERM GOAL #5   Title demonstrate 5/5 bil knee strength for improved function    Status On-going                 Plan - 07/03/20 1139    Clinical Impression Statement He has done very well with PT, he has weaned out of braces and now able to progress to squats, deadlifts, and weight machines with good quad control and without complaints. 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 He will follow up with MD 07/05/20. He will continue to benefit from more PT to address his mild deficits in ROM and strength.    Personal Factors and Comorbidities Comorbidity 3+    Comorbidities PE, HTN, Rt quad tendon repair    Examination-Activity Limitations Bathing;Sit;Squat;Stairs;Stand;Dressing;Transfers;Hygiene/Grooming;Locomotion Level    Examination-Participation Restrictions Community Activity;Driving;Occupation    Stability/Clinical Decision Making Evolving/Moderate complexity    Rehab Potential Good    PT Frequency 2x / week   1x/wk x 3-4 wks, then 2x/wk x 8 wks   PT Duration  12 weeks    PT Treatment/Interventions ADLs/Self Care Home Management;Cryotherapy;Electrical Stimulation;Moist Heat;Balance training;Therapeutic exercise;Therapeutic activities;Stair training;Gait training;DME  Instruction;Ultrasound;Neuromuscular re-education;Patient/family education;Orthotic Fit/Training;Manual techniques;Vasopneumatic Device;Taping;Dry needling;Passive range of motion    PT Next Visit Plan work on quad/hip strength progression and gait/balance; will need note for MD    PT Home Exercise Plan Access Code: [email or web address]    Consulted and Agree with Plan of Care Patient           Patient will benefit from skilled therapeutic intervention in order to improve the following deficits and impairments:  Abnormal gait, Increased edema, Decreased knowledge of use of DME, Decreased strength, Pain, Decreased mobility, Decreased balance, Difficulty walking, Decreased range of motion  Visit Diagnosis: Difficulty walking  Acute pain of left knee  Acute pain of right knee  Stiffness of left knee, not elsewhere classified  Stiffness of right knee, not elsewhere classified  Muscle weakness (generalized)  Other abnormalities of gait and mobility     Problem List Patient Active Problem List   Diagnosis Date Noted  . Quadriceps tendon rupture, left, initial encounter 05/03/2020  . Rupture, tendon, quadriceps, left, initial encounter 04/19/2020  . Displaced comminuted fracture of right patella, initial encounter for closed fracture 04/19/2020  . Pulmonary embolism without acute cor pulmonale ([place])   . Elevated troponin 06/22/2015  . Chest pain 06/22/2015  . SOB (shortness of breath) 06/22/2015  . AKI (acute kidney injury) ([place]) 06/22/2015  . Essential hypertension   . Back pain   . Sleep apnea     [place] 07/03/2020, 11:53 AM  [facility] Physical Therapy [address] [place], [place], [address] Phone: [phone]   Fax:  [phone]  Name: [place] MRN: [id number] Date of Birth: 06/11/1961

## 2020-07-05 ENCOUNTER — Ambulatory Visit (INDEPENDENT_AMBULATORY_CARE_PROVIDER_SITE_OTHER): Payer: Self-pay | Admitting: Orthopaedic Surgery

## 2020-07-05 ENCOUNTER — Other Ambulatory Visit: Payer: Self-pay

## 2020-07-05 ENCOUNTER — Encounter: Payer: Self-pay | Admitting: Orthopaedic Surgery

## 2020-07-05 DIAGNOSIS — S76112A Strain of left quadriceps muscle, fascia and tendon, initial encounter: Secondary | ICD-10-CM

## 2020-07-05 DIAGNOSIS — S82041A Displaced comminuted fracture of right patella, initial encounter for closed fracture: Secondary | ICD-10-CM

## 2020-07-05 NOTE — Progress Notes (Signed)
   Post-Op Visit Note   Patient: [place]           Date of Birth: [birth year]           MRN: [id number] Visit Date: 07/05/2020 PCP: System, Provider Not In   Assessment & Plan:  Chief Complaint:  Chief Complaint  Patient presents with  . Right Knee - Pain  . Left Knee - Pain   Visit Diagnoses:  1. Rupture, tendon, quadriceps, left, initial encounter   2. Displaced comminuted fracture of right patella, initial encounter for closed fracture   3. Quadriceps tendon rupture, left, initial encounter     Plan:  Logan [place] is almost 92-month status post right patella tendon repair and left quadriceps repair.  He is doing well has no pain.  He has been progressing through physical therapy very quickly and ahead of schedule.  He is doing it once to twice a week.  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  Surgical scars are fully healed.  Excellent range of motion.  He is able to propel himself out of a chair without any assistance.  His quadricep strength is improving.  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  Work note was provided today.  Continue with physical therapy and I would like to recheck in 2 months.  Follow-Up Instructions: Return in about 2 months (around 09/04/2020).   Orders:  No orders of the defined types were placed in this encounter.  No orders of the defined types were placed in this encounter.   Imaging: No results found.  PMFS History: Patient Active Problem List   Diagnosis Date Noted  . Quadriceps tendon rupture, left, initial encounter 05/03/2020  . Rupture, tendon, quadriceps, left, initial encounter 04/19/2020  . Displaced comminuted fracture of right patella, initial encounter for closed fracture 04/19/2020  . Pulmonary embolism without acute cor pulmonale ([facility])   . Elevated troponin 06/22/2015  . Chest pain 06/22/2015  . SOB (shortness of breath) 06/22/2015  . AKI (acute kidney injury) ([facility]) 06/22/2015  . Essential hypertension    . Back pain   . Sleep apnea    Past Medical History:  Diagnosis Date  . Avulsion of right patellar tendon   . Back pain   . Hx pulmonary embolism 2016   took xarelto x1 yr  . Hypertension   . Ruptured, tendon, quadriceps, left, initial encounter   . Sleep apnea    does not use CPAP    Family History  Problem Relation Age of Onset  . Hypertension Mother     Past Surgical History:  Procedure Laterality Date  . Heel spurs    . KNEE SURGERY Right   . PATELLAR TENDON REPAIR Right 04/19/2020   Procedure: RIGHT PATELLA TENDON REPAIR;  Surgeon: Tarry Kos, MD;  Location: [facility];  Service: Orthopedics;  Laterality: Right;  . Plantar fascitis    . QUADRICEPS TENDON REPAIR Left 04/19/2020   Procedure: REPAIR LEFT QUADRICEPS TENDON;  Surgeon: Tarry Kos, MD;  Location: [facility];  Service: Orthopedics;  Laterality: Left;   Social History   Occupational History  . Occupation: DJ  Tobacco Use  . Smoking status: Never Smoker  . Smokeless tobacco: Never Used  Substance and Sexual Activity  . Alcohol use: No  . Drug use: No  . Sexual activity: Yes    Birth control/protection: Condom

## 2020-07-08 ENCOUNTER — Encounter: Payer: Self-pay | Admitting: Physical Therapy

## 2020-07-10 ENCOUNTER — Encounter: Payer: Self-pay | Admitting: Physical Therapy

## 2020-07-10 ENCOUNTER — Ambulatory Visit (INDEPENDENT_AMBULATORY_CARE_PROVIDER_SITE_OTHER): Payer: Self-pay | Admitting: Physical Therapy

## 2020-07-10 ENCOUNTER — Other Ambulatory Visit: Payer: Self-pay

## 2020-07-10 DIAGNOSIS — R2681 Unsteadiness on feet: Secondary | ICD-10-CM

## 2020-07-10 DIAGNOSIS — M25561 Pain in right knee: Secondary | ICD-10-CM

## 2020-07-10 DIAGNOSIS — M25662 Stiffness of left knee, not elsewhere classified: Secondary | ICD-10-CM

## 2020-07-10 DIAGNOSIS — R2689 Other abnormalities of gait and mobility: Secondary | ICD-10-CM

## 2020-07-10 DIAGNOSIS — M25562 Pain in left knee: Secondary | ICD-10-CM

## 2020-07-10 DIAGNOSIS — M25661 Stiffness of right knee, not elsewhere classified: Secondary | ICD-10-CM

## 2020-07-10 DIAGNOSIS — R262 Difficulty in walking, not elsewhere classified: Secondary | ICD-10-CM

## 2020-07-10 DIAGNOSIS — M6281 Muscle weakness (generalized): Secondary | ICD-10-CM

## 2020-07-10 NOTE — Therapy (Signed)
[facility] Physical Therapy [address] [place], [place], [address] Phone: [phone]   Fax:  [phone]  Physical Therapy Treatment  Patient Details  Name: [place] MRN: [id number] Date of Birth: 08-10-1961 Referring Provider (PT): Leandrew Koyanagi, MD   Encounter Date: 07/10/2020   PT End of Session - 07/10/20 1057    Visit Number 9    Number of Visits 20    Date for PT Re-Evaluation 08/07/20    Authorization Type Self-Pay    PT Start Time 1018    PT Stop Time 1058    PT Time Calculation (min) 40 min    Activity Tolerance Patient tolerated treatment well;No increased pain    Behavior During Therapy WFL for tasks assessed/performed           Past Medical History:  Diagnosis Date  . Avulsion of right patellar tendon   . Back pain   . Hx pulmonary embolism 2016   took xarelto x1 yr  . Hypertension   . Ruptured, tendon, quadriceps, left, initial encounter   . Sleep apnea    does not use CPAP    Past Surgical History:  Procedure Laterality Date  . Heel spurs    . KNEE SURGERY Right   . PATELLAR TENDON REPAIR Right 04/19/2020   Procedure: RIGHT PATELLA TENDON REPAIR;  Surgeon: Leandrew Koyanagi, MD;  Location: [place];  Service: Orthopedics;  Laterality: Right;  . Plantar fascitis    . QUADRICEPS TENDON REPAIR Left 04/19/2020   Procedure: REPAIR LEFT QUADRICEPS TENDON;  Surgeon: Leandrew Koyanagi, MD;  Location: [place];  Service: Orthopedics;  Laterality: Left;    There were no vitals filed for this visit.   Subjective Assessment - 07/10/20 1020    Subjective doing well - MD is pleased with progress, wants to continue to work on strengthening    Pertinent History PE, SOB, HTN, Rt quad tendon repair    Limitations Standing;Walking;Sitting    Patient Stated Goals be able to return to regular function    Currently in Pain? No/denies    Pain Onset More than a month ago                              [facility] Adult PT Treatment/Exercise - 07/10/20 1021      Knee/Hip Exercises: Aerobic   Nustep L7 X 10 min      Knee/Hip Exercises: Machines for Strengthening   Cybex Knee Extension 15 lbs bilat push 4 sets of 10    Cybex Knee Flexion 45 lbs bilat pull 4X10    Total Gym Leg Press shuttle leg press BLEs 162# 15 reps 3 sets      Knee/Hip Exercises: Standing   Functional Squat 3 sets;10 reps;5 seconds    Other Standing Knee Exercises deadlift 15 lbs from floor 3x10                  PT Education - 07/10/20 1056    Education Details gym program    Person(s) Educated Patient    Methods Explanation;Demonstration;Handout    Comprehension Verbalized understanding            PT Short Term Goals - 06/18/20 1336      PT SHORT TERM GOAL #1   Title independent with initial HEP    Baseline MET 06/18/2020    Status Achieved    Target Date 06/12/20  PT SHORT TERM GOAL #2   Title improve bil knee flexion to at least 60 deg for improved function    Baseline MET 06/18/2020    Status Achieved    Target Date 06/12/20             PT Long Term Goals - 07/03/20 1152      PT LONG TERM GOAL #1   Title independent with advanced HEP    Status On-going      PT LONG TERM GOAL #2   Title improve bil knee AROM 0-120 for improved function and mobility    Status Achieved      PT LONG TERM GOAL #3   Title amb without AD or significant deviations independently for improved function    Status Achieved      PT LONG TERM GOAL #4   Title report pain < 2/10 for improved function    Status On-going      PT LONG TERM GOAL #5   Title demonstrate 5/5 bil knee strength for improved function    Status On-going                 Plan - 07/10/20 1057    Clinical Impression Statement Session today focused on strengthening with providing gym program for transisiton to home.  Noticed some mild Rt foot drop with ambulation, so will need to monitor  and work on ankle strengthening as well as gait mechanics.    Personal Factors and Comorbidities Comorbidity 3+    Comorbidities PE, HTN, Rt quad tendon repair    Examination-Activity Limitations Bathing;Sit;Squat;Stairs;Stand;Dressing;Transfers;Hygiene/Grooming;Locomotion Level    Examination-Participation Restrictions Community Activity;Driving;Occupation    Stability/Clinical Decision Making Evolving/Moderate complexity    Rehab Potential Good    PT Frequency 2x / week   1x/wk x 3-4 wks, then 2x/wk x 8 wks   PT Duration 12 weeks    PT Treatment/Interventions ADLs/Self Care Home Management;Cryotherapy;Electrical Stimulation;Moist Heat;Balance training;Therapeutic exercise;Therapeutic activities;Stair training;Gait training;DME Instruction;Ultrasound;Neuromuscular re-education;Patient/family education;Orthotic Fit/Training;Manual techniques;Vasopneumatic Device;Taping;Dry needling;Passive range of motion    PT Next Visit Plan work on quad/hip strength progression and gait/balance; ankle strengthening/gastroc stretching    PT Home Exercise Plan Access Code: [email or web address]    Consulted and Agree with Plan of Care Patient           Patient will benefit from skilled therapeutic intervention in order to improve the following deficits and impairments:  Abnormal gait, Increased edema, Decreased knowledge of use of DME, Decreased strength, Pain, Decreased mobility, Decreased balance, Difficulty walking, Decreased range of motion  Visit Diagnosis: Difficulty walking  Acute pain of left knee  Acute pain of right knee  Stiffness of left knee, not elsewhere classified  Stiffness of right knee, not elsewhere classified  Muscle weakness (generalized)  Other abnormalities of gait and mobility  Unsteadiness on feet     Problem List Patient Active Problem List   Diagnosis Date Noted  . Quadriceps tendon rupture, left, initial encounter 05/03/2020  . Rupture, tendon, quadriceps, left, initial  encounter 04/19/2020  . Displaced comminuted fracture of right patella, initial encounter for closed fracture 04/19/2020  . Pulmonary embolism without acute cor pulmonale ([place])   . Elevated troponin 06/22/2015  . Chest pain 06/22/2015  . SOB (shortness of breath) 06/22/2015  . AKI (acute kidney injury) ([place]) 06/22/2015  . Essential hypertension   . Back pain   . Sleep apnea       Laureen Abrahams, PT, DPT 07/10/20 11:05 AM    [facility]  Physical Therapy [address] [place], [place], [address] Phone: [phone]   Fax:  [phone]  Name: [place] MRN: [id number] Date of Birth: [birth year]

## 2020-07-10 NOTE — Patient Instructions (Signed)
Access Code: URL: [email or web address] Date: 07/10/2020 Prepared by: Moshe Cipro  Exercises Full Leg Press - 1 x daily - 7 x weekly - 10 reps - 2 sets Knee Extension with Weight Machine - 1 x daily - 7 x weekly - 10 reps - 2 sets Hamstring Curl with Weight Machine - 1 x daily - 7 x weekly - 10 reps - 2 sets Kettlebell Deadlift - 1 x daily - 7 x weekly - 3 sets - 10 reps Squat with TRX - 1 x daily - 7 x weekly - 3 sets - 10 reps

## 2020-07-15 ENCOUNTER — Other Ambulatory Visit: Payer: Self-pay

## 2020-07-15 ENCOUNTER — Ambulatory Visit (INDEPENDENT_AMBULATORY_CARE_PROVIDER_SITE_OTHER): Payer: Self-pay | Admitting: Physical Therapy

## 2020-07-15 DIAGNOSIS — M25562 Pain in left knee: Secondary | ICD-10-CM

## 2020-07-15 DIAGNOSIS — R262 Difficulty in walking, not elsewhere classified: Secondary | ICD-10-CM

## 2020-07-15 DIAGNOSIS — M25661 Stiffness of right knee, not elsewhere classified: Secondary | ICD-10-CM

## 2020-07-15 DIAGNOSIS — M25561 Pain in right knee: Secondary | ICD-10-CM

## 2020-07-15 DIAGNOSIS — R2689 Other abnormalities of gait and mobility: Secondary | ICD-10-CM

## 2020-07-15 DIAGNOSIS — M25662 Stiffness of left knee, not elsewhere classified: Secondary | ICD-10-CM

## 2020-07-15 DIAGNOSIS — M6281 Muscle weakness (generalized): Secondary | ICD-10-CM

## 2020-07-15 NOTE — Therapy (Signed)
[facility] Physical Therapy [address] [place], [place], [address] Phone: [phone]   Fax:  [phone]  Physical Therapy Treatment  Patient Details  Name: Logan Mccarthy MRN: [id number] Date of Birth: 1961/07/06 Referring Provider (PT): Leandrew Koyanagi, MD   Encounter Date: 07/15/2020   PT End of Session - 07/15/20 1138    Visit Number 10    Number of Visits 20    Date for PT Re-Evaluation 08/07/20    Authorization Type Self-Pay    PT Start Time 1100    PT Stop Time 1144    PT Time Calculation (min) 44 min    Activity Tolerance Patient tolerated treatment well;No increased pain    Behavior During Therapy WFL for tasks assessed/performed           Past Medical History:  Diagnosis Date  . Avulsion of right patellar tendon   . Back pain   . Hx pulmonary embolism 2016   took xarelto x1 yr  . Hypertension   . Ruptured, tendon, quadriceps, left, initial encounter   . Sleep apnea    does not use CPAP    Past Surgical History:  Procedure Laterality Date  . Heel spurs    . KNEE SURGERY Right   . PATELLAR TENDON REPAIR Right 04/19/2020   Procedure: RIGHT PATELLA TENDON REPAIR;  Surgeon: Leandrew Koyanagi, MD;  Location: [place];  Service: Orthopedics;  Laterality: Right;  . Plantar fascitis    . QUADRICEPS TENDON REPAIR Left 04/19/2020   Procedure: REPAIR LEFT QUADRICEPS TENDON;  Surgeon: Leandrew Koyanagi, MD;  Location: [place];  Service: Orthopedics;  Laterality: Left;    There were no vitals filed for this visit.   Subjective Assessment - 07/15/20 1141    Subjective not really having pain but feels he needs more quad strenghtening    Pertinent History PE, SOB, HTN, Rt quad tendon repair    Limitations Standing;Walking;Sitting    Patient Stated Goals be able to return to regular function    Currently in Pain? No/denies    Pain Onset More than a month ago              [facility] PT Assessment - 07/15/20 0001       Assessment   Medical Diagnosis status post left quadriceps tendon repair and right partial patellectomy for patella fracture    Referring Provider (PT) Leandrew Koyanagi, MD    Onset Date/Surgical Date 04/19/20      AROM   Right Knee Flexion 120    Left Knee Flexion 123      PROM   Right Knee Flexion 130    Left Knee Flexion 130                         [facility] Adult PT Treatment/Exercise - 07/15/20 0001      Knee/Hip Exercises: Stretches   Sports administrator Both;3 reps;30 seconds    Quad Stretch Limitations prone with strap      Knee/Hip Exercises: Aerobic   Nustep L8 X 10 min      Knee/Hip Exercises: Machines for Strengthening   Cybex Knee Extension 20 lbs bilat push 3 sets of 10    Cybex Knee Flexion 45 lbs bilat pull 4X10    Total Gym Leg Press shuttle leg press BLEs 162# 15 reps 3 sets      Knee/Hip Exercises: Standing   Functional Squat 2 sets;10 reps  Functional Squat Limitations with UE support    Other Standing Knee Exercises heel walking, march walking up/down at counter X 4 ea, deadlift 20 lbs 15, then 10 reps    Other Standing Knee Exercises mini lunges with one UE support X 15 reps bilat      Knee/Hip Exercises: Seated   Sit to Sand 2 sets;10 reps;without UE support   Lt leg further in front                   PT Short Term Goals - 06/18/20 1336      PT SHORT TERM GOAL #1   Title independent with initial HEP    Baseline MET 06/18/2020    Status Achieved    Target Date 06/12/20      PT SHORT TERM GOAL #2   Title improve bil knee flexion to at least 60 deg for improved function    Baseline MET 06/18/2020    Status Achieved    Target Date 06/12/20             PT Long Term Goals - 07/03/20 1152      PT LONG TERM GOAL #1   Title independent with advanced HEP    Status On-going      PT LONG TERM GOAL #2   Title improve bil knee AROM 0-120 for improved function and mobility    Status Achieved      PT LONG TERM GOAL #3   Title amb  without AD or significant deviations independently for improved function    Status Achieved      PT LONG TERM GOAL #4   Title report pain < 2/10 for improved function    Status On-going      PT LONG TERM GOAL #5   Title demonstrate 5/5 bil knee strength for improved function    Status On-going                 Plan - 07/15/20 1142    Clinical Impression Statement Updated HEP today to progress strengthening at home with addition of heel walking due to his mild Rt foot drop. He had great tolerance and understanding of HEP progression. Continue to progress functional strengh as able.    Personal Factors and Comorbidities Comorbidity 3+    Comorbidities PE, HTN, Rt quad tendon repair    Examination-Activity Limitations Bathing;Sit;Squat;Stairs;Stand;Dressing;Transfers;Hygiene/Grooming;Locomotion Level    Examination-Participation Restrictions Community Activity;Driving;Occupation    Stability/Clinical Decision Making Evolving/Moderate complexity    Rehab Potential Good    PT Frequency 2x / week   1x/wk x 3-4 wks, then 2x/wk x 8 wks   PT Duration 12 weeks    PT Treatment/Interventions ADLs/Self Care Home Management;Cryotherapy;Electrical Stimulation;Moist Heat;Balance training;Therapeutic exercise;Therapeutic activities;Stair training;Gait training;DME Instruction;Ultrasound;Neuromuscular re-education;Patient/family education;Orthotic Fit/Training;Manual techniques;Vasopneumatic Device;Taping;Dry needling;Passive range of motion    PT Next Visit Plan work on quad/hip strength progression and gait/balance; ankle strengthening/gastroc stretching    PT Home Exercise Plan Access Code: [email or web address]    Consulted and Agree with Plan of Care Patient           Patient will benefit from skilled therapeutic intervention in order to improve the following deficits and impairments:  Abnormal gait, Increased edema, Decreased knowledge of use of DME, Decreased strength, Pain, Decreased mobility,  Decreased balance, Difficulty walking, Decreased range of motion  Visit Diagnosis: Difficulty walking  Acute pain of left knee  Acute pain of right knee  Stiffness of left knee, not elsewhere classified  Stiffness of right  knee, not elsewhere classified  Muscle weakness (generalized)  Other abnormalities of gait and mobility     Problem List Patient Active Problem List   Diagnosis Date Noted  . Quadriceps tendon rupture, left, initial encounter 05/03/2020  . Rupture, tendon, quadriceps, left, initial encounter 04/19/2020  . Displaced comminuted fracture of right patella, initial encounter for closed fracture 04/19/2020  . Pulmonary embolism without acute cor pulmonale ([place])   . Elevated troponin 06/22/2015  . Chest pain 06/22/2015  . SOB (shortness of breath) 06/22/2015  . AKI (acute kidney injury) ([place]) 06/22/2015  . Essential hypertension   . Back pain   . Sleep apnea     [name] 07/15/2020, 11:46 AM  [facility] Physical Therapy [address] [place], [place], [address] Phone: [phone]   Fax:  [phone]  Name: Dhiren Azimi MRN: [id number] Date of Birth: 01-Dec-1960

## 2020-07-15 NOTE — Patient Instructions (Signed)
Access Code: [email or web address] URL: [email or web address] Date: 07/15/2020 Prepared by: Ivery Quale  Exercises Sit to Stand without Arm Support - 2 x daily - 6 x weekly - 2-3 sets - 10 reps Partial Lunge with Chair - 2 x daily - 6 x weekly - 2-3 sets - 10 reps Walking March - 2 x daily - 6 x weekly - 3 sets - 10 reps Heel Walking - 2 x daily - 6 x weekly - 3 sets - 10 reps Squat with Counter Support - 1 x daily - 7 x weekly - 2-3 sets - 10 reps - 5 hold

## 2020-07-17 ENCOUNTER — Other Ambulatory Visit: Payer: Self-pay

## 2020-07-17 ENCOUNTER — Encounter: Payer: Self-pay | Admitting: Physical Therapy

## 2020-07-17 ENCOUNTER — Ambulatory Visit (INDEPENDENT_AMBULATORY_CARE_PROVIDER_SITE_OTHER): Payer: Self-pay | Admitting: Physical Therapy

## 2020-07-17 DIAGNOSIS — M25561 Pain in right knee: Secondary | ICD-10-CM

## 2020-07-17 DIAGNOSIS — M25662 Stiffness of left knee, not elsewhere classified: Secondary | ICD-10-CM

## 2020-07-17 DIAGNOSIS — R262 Difficulty in walking, not elsewhere classified: Secondary | ICD-10-CM

## 2020-07-17 DIAGNOSIS — R2689 Other abnormalities of gait and mobility: Secondary | ICD-10-CM

## 2020-07-17 DIAGNOSIS — M6281 Muscle weakness (generalized): Secondary | ICD-10-CM

## 2020-07-17 DIAGNOSIS — M25562 Pain in left knee: Secondary | ICD-10-CM

## 2020-07-17 DIAGNOSIS — R2681 Unsteadiness on feet: Secondary | ICD-10-CM

## 2020-07-17 DIAGNOSIS — M25661 Stiffness of right knee, not elsewhere classified: Secondary | ICD-10-CM

## 2020-07-17 NOTE — Therapy (Signed)
[facility] Physical Therapy [address] [place], [place], [address] Phone: [phone]   Fax:  [phone]  Physical Therapy Treatment  Patient Details  Name: Logan Mccarthy MRN: [id number] Date of Birth: 1961/01/20 Referring Provider (PT): Leandrew Koyanagi, MD   Encounter Date: 07/17/2020   PT End of Session - 07/17/20 1124    Visit Number 11    Number of Visits 20    Date for PT Re-Evaluation 08/07/20    Authorization Type Self-Pay    PT Start Time 1018    PT Stop Time 1059    PT Time Calculation (min) 41 min    Activity Tolerance Patient tolerated treatment well;No increased pain    Behavior During Therapy WFL for tasks assessed/performed           Past Medical History:  Diagnosis Date  . Avulsion of right patellar tendon   . Back pain   . Hx pulmonary embolism 2016   took xarelto x1 yr  . Hypertension   . Ruptured, tendon, quadriceps, left, initial encounter   . Sleep apnea    does not use CPAP    Past Surgical History:  Procedure Laterality Date  . Heel spurs    . KNEE SURGERY Right   . PATELLAR TENDON REPAIR Right 04/19/2020   Procedure: RIGHT PATELLA TENDON REPAIR;  Surgeon: Leandrew Koyanagi, MD;  Location: [place];  Service: Orthopedics;  Laterality: Right;  . Plantar fascitis    . QUADRICEPS TENDON REPAIR Left 04/19/2020   Procedure: REPAIR LEFT QUADRICEPS TENDON;  Surgeon: Leandrew Koyanagi, MD;  Location: [place];  Service: Orthopedics;  Laterality: Left;    There were no vitals filed for this visit.   Subjective Assessment - 07/17/20 1026    Subjective overall doing well, working on building strength    Pertinent History PE, SOB, HTN, Rt quad tendon repair    Limitations Standing;Walking;Sitting    Patient Stated Goals be able to return to regular function    Currently in Pain? No/denies    Pain Onset More than a month ago                             [facility] Adult PT Treatment/Exercise  - 07/17/20 0001      Knee/Hip Exercises: Aerobic   Nustep L9 X 10 min      Knee/Hip Exercises: Machines for Strengthening   Total Gym Leg Press shuttle leg press BLEs 162# 15 reps 3 sets; single leg bil 75# 3x15      Knee/Hip Exercises: Standing   Functional Squat 2 sets;10 reps    Functional Squat Limitations with cables: 50# first set; 30# second set    Other Standing Knee Exercises heel walking x 4 laps, slow marching x 4 laps                    PT Short Term Goals - 06/18/20 1336      PT SHORT TERM GOAL #1   Title independent with initial HEP    Baseline MET 06/18/2020    Status Achieved    Target Date 06/12/20      PT SHORT TERM GOAL #2   Title improve bil knee flexion to at least 60 deg for improved function    Baseline MET 06/18/2020    Status Achieved    Target Date 06/12/20  PT Long Term Goals - 07/03/20 1152      PT LONG TERM GOAL #1   Title independent with advanced HEP    Status On-going      PT LONG TERM GOAL #2   Title improve bil knee AROM 0-120 for improved function and mobility    Status Achieved      PT LONG TERM GOAL #3   Title amb without AD or significant deviations independently for improved function    Status Achieved      PT LONG TERM GOAL #4   Title report pain < 2/10 for improved function    Status On-going      PT LONG TERM GOAL #5   Title demonstrate 5/5 bil knee strength for improved function    Status On-going                 Plan - 07/17/20 1124    Clinical Impression Statement Pt tolerated session well today with continued focus on strengthening.  Progressing well with PT and on target to d/c at end of POC.    Personal Factors and Comorbidities Comorbidity 3+    Comorbidities PE, HTN, Rt quad tendon repair    Examination-Activity Limitations Bathing;Sit;Squat;Stairs;Stand;Dressing;Transfers;Hygiene/Grooming;Locomotion Level    Examination-Participation Restrictions Community  Activity;Driving;Occupation    Stability/Clinical Decision Making Evolving/Moderate complexity    Rehab Potential Good    PT Frequency 2x / week   1x/wk x 3-4 wks, then 2x/wk x 8 wks   PT Duration 12 weeks    PT Treatment/Interventions ADLs/Self Care Home Management;Cryotherapy;Electrical Stimulation;Moist Heat;Balance training;Therapeutic exercise;Therapeutic activities;Stair training;Gait training;DME Instruction;Ultrasound;Neuromuscular re-education;Patient/family education;Orthotic Fit/Training;Manual techniques;Vasopneumatic Device;Taping;Dry needling;Passive range of motion    PT Next Visit Plan work on quad/hip strength progression and gait/balance; ankle strengthening/gastroc stretching    PT Home Exercise Plan Access Code: [email or web address]    Consulted and Agree with Plan of Care Patient           Patient will benefit from skilled therapeutic intervention in order to improve the following deficits and impairments:  Abnormal gait, Increased edema, Decreased knowledge of use of DME, Decreased strength, Pain, Decreased mobility, Decreased balance, Difficulty walking, Decreased range of motion  Visit Diagnosis: Difficulty walking  Acute pain of left knee  Acute pain of right knee  Stiffness of left knee, not elsewhere classified  Stiffness of right knee, not elsewhere classified  Muscle weakness (generalized)  Other abnormalities of gait and mobility  Unsteadiness on feet     Problem List Patient Active Problem List   Diagnosis Date Noted  . Quadriceps tendon rupture, left, initial encounter 05/03/2020  . Rupture, tendon, quadriceps, left, initial encounter 04/19/2020  . Displaced comminuted fracture of right patella, initial encounter for closed fracture 04/19/2020  . Pulmonary embolism without acute cor pulmonale ([place])   . Elevated troponin 06/22/2015  . Chest pain 06/22/2015  . SOB (shortness of breath) 06/22/2015  . AKI (acute kidney injury) ([place]) 06/22/2015  .  Essential hypertension   . Back pain   . Sleep apnea       Laureen Abrahams, PT, DPT 07/17/20 11:26 AM    [facility] Physical Therapy [address] [place], [place], [address] Phone: [phone]   Fax:  [phone]  Name: Logan Mccarthy MRN: [id number] Date of Birth: 17-Sep-1960

## 2020-07-24 ENCOUNTER — Ambulatory Visit (INDEPENDENT_AMBULATORY_CARE_PROVIDER_SITE_OTHER): Payer: Self-pay | Admitting: Physical Therapy

## 2020-07-24 ENCOUNTER — Other Ambulatory Visit: Payer: Self-pay

## 2020-07-24 DIAGNOSIS — M25562 Pain in left knee: Secondary | ICD-10-CM

## 2020-07-24 DIAGNOSIS — R262 Difficulty in walking, not elsewhere classified: Secondary | ICD-10-CM

## 2020-07-24 DIAGNOSIS — M25662 Stiffness of left knee, not elsewhere classified: Secondary | ICD-10-CM

## 2020-07-24 DIAGNOSIS — M25661 Stiffness of right knee, not elsewhere classified: Secondary | ICD-10-CM

## 2020-07-24 DIAGNOSIS — M25561 Pain in right knee: Secondary | ICD-10-CM

## 2020-07-24 DIAGNOSIS — R2689 Other abnormalities of gait and mobility: Secondary | ICD-10-CM

## 2020-07-24 DIAGNOSIS — M6281 Muscle weakness (generalized): Secondary | ICD-10-CM

## 2020-07-24 NOTE — Therapy (Signed)
[facility] Physical Therapy [address] [place], [place], [address] Phone: [phone]   Fax:  [phone]  Physical Therapy Treatment  Patient Details  Name: Logan Mccarthy MRN: [id number] Date of Birth: 06-16-1961 Referring Provider (PT): Leandrew Koyanagi, MD   Encounter Date: 07/24/2020   PT End of Session - 07/24/20 1355    Visit Number 12    Number of Visits 20    Date for PT Re-Evaluation 08/07/20    Authorization Type Self-Pay    PT Start Time 5638    PT Stop Time 1230    PT Time Calculation (min) 45 min    Activity Tolerance Patient tolerated treatment well;No increased pain    Behavior During Therapy WFL for tasks assessed/performed           Past Medical History:  Diagnosis Date  . Avulsion of right patellar tendon   . Back pain   . Hx pulmonary embolism 2016   took xarelto x1 yr  . Hypertension   . Ruptured, tendon, quadriceps, left, initial encounter   . Sleep apnea    does not use CPAP    Past Surgical History:  Procedure Laterality Date  . Heel spurs    . KNEE SURGERY Right   . PATELLAR TENDON REPAIR Right 04/19/2020   Procedure: RIGHT PATELLA TENDON REPAIR;  Surgeon: Leandrew Koyanagi, MD;  Location: [place];  Service: Orthopedics;  Laterality: Right;  . Plantar fascitis    . QUADRICEPS TENDON REPAIR Left 04/19/2020   Procedure: REPAIR LEFT QUADRICEPS TENDON;  Surgeon: Leandrew Koyanagi, MD;  Location: [place];  Service: Orthopedics;  Laterality: Left;    There were no vitals filed for this visit.   Subjective Assessment - 07/24/20 1354    Subjective relays no pain but knows he needs to work on strength    Pertinent History PE, SOB, HTN, Rt quad tendon repair    Limitations Standing;Walking;Sitting    Patient Stated Goals be able to return to regular function    Pain Onset More than a month ago             [facility] Adult PT Treatment/Exercise - 07/24/20 0001      Knee/Hip Exercises: Aerobic   Tread  Mill 2 mph, 5% incline 7 min, walking with UE support    Nustep L10 X 10 min      Knee/Hip Exercises: Machines for Strengthening   Total Gym Leg Press shuttle leg press BLEs 162# 15 reps 3 sets; single leg bil 75# 3x15      Knee/Hip Exercises: Standing   Forward Step Up Limitations step up with alt leg march, no UE support, 4.5 inch X 10 reps bilat    Other Standing Knee Exercises heel walking x 4 laps, slow marching x 4 laps    Other Standing Knee Exercises TRX mini lunges X 10 bilat, TRX squats 2 X 10 reps                    PT Short Term Goals - 06/18/20 1336      PT SHORT TERM GOAL #1   Title independent with initial HEP    Baseline MET 06/18/2020    Status Achieved    Target Date 06/12/20      PT SHORT TERM GOAL #2   Title improve bil knee flexion to at least 60 deg for improved function    Baseline MET 06/18/2020    Status Achieved  Target Date 06/12/20             PT Long Term Goals - 07/03/20 1152      PT LONG TERM GOAL #1   Title independent with advanced HEP    Status On-going      PT LONG TERM GOAL #2   Title improve bil knee AROM 0-120 for improved function and mobility    Status Achieved      PT LONG TERM GOAL #3   Title amb without AD or significant deviations independently for improved function    Status Achieved      PT LONG TERM GOAL #4   Title report pain < 2/10 for improved function    Status On-going      PT LONG TERM GOAL #5   Title demonstrate 5/5 bil knee strength for improved function    Status On-going                 Plan - 07/24/20 1356    Clinical Impression Statement Continued to work on functional quad strength with good overall tolerance. Overall he shows great effort and is doing as expected with PT.    Personal Factors and Comorbidities Comorbidity 3+    Comorbidities PE, HTN, Rt quad tendon repair    Examination-Activity Limitations  Bathing;Sit;Squat;Stairs;Stand;Dressing;Transfers;Hygiene/Grooming;Locomotion Level    Examination-Participation Restrictions Community Activity;Driving;Occupation    Stability/Clinical Decision Making Evolving/Moderate complexity    Rehab Potential Good    PT Frequency 2x / week   1x/wk x 3-4 wks, then 2x/wk x 8 wks   PT Duration 12 weeks    PT Treatment/Interventions ADLs/Self Care Home Management;Cryotherapy;Electrical Stimulation;Moist Heat;Balance training;Therapeutic exercise;Therapeutic activities;Stair training;Gait training;DME Instruction;Ultrasound;Neuromuscular re-education;Patient/family education;Orthotic Fit/Training;Manual techniques;Vasopneumatic Device;Taping;Dry needling;Passive range of motion    PT Next Visit Plan work on quad/hip strength progression and gait/balance; ankle strengthening/gastroc stretching    PT Home Exercise Plan Access Code: [email or web address]    Consulted and Agree with Plan of Care Patient           Patient will benefit from skilled therapeutic intervention in order to improve the following deficits and impairments:  Abnormal gait, Increased edema, Decreased knowledge of use of DME, Decreased strength, Pain, Decreased mobility, Decreased balance, Difficulty walking, Decreased range of motion  Visit Diagnosis: Difficulty walking  Acute pain of left knee  Acute pain of right knee  Stiffness of left knee, not elsewhere classified  Stiffness of right knee, not elsewhere classified  Muscle weakness (generalized)  Other abnormalities of gait and mobility     Problem List Patient Active Problem List   Diagnosis Date Noted  . Quadriceps tendon rupture, left, initial encounter 05/03/2020  . Rupture, tendon, quadriceps, left, initial encounter 04/19/2020  . Displaced comminuted fracture of right patella, initial encounter for closed fracture 04/19/2020  . Pulmonary embolism without acute cor pulmonale ([place])   . Elevated troponin 06/22/2015  . Chest  pain 06/22/2015  . SOB (shortness of breath) 06/22/2015  . AKI (acute kidney injury) ([place]) 06/22/2015  . Essential hypertension   . Back pain   . Sleep apnea     [name] 07/24/2020, 1:57 PM  [facility] Physical Therapy [address] [place], [place], [address] Phone: [phone]   Fax:  [phone]  Name: Shaylon Aden MRN: [id number] Date of Birth: 30-Jun-1961

## 2020-07-29 ENCOUNTER — Other Ambulatory Visit: Payer: Self-pay

## 2020-07-29 ENCOUNTER — Ambulatory Visit (INDEPENDENT_AMBULATORY_CARE_PROVIDER_SITE_OTHER): Payer: Self-pay | Admitting: Physical Therapy

## 2020-07-29 DIAGNOSIS — R262 Difficulty in walking, not elsewhere classified: Secondary | ICD-10-CM

## 2020-07-29 DIAGNOSIS — M25661 Stiffness of right knee, not elsewhere classified: Secondary | ICD-10-CM

## 2020-07-29 DIAGNOSIS — M25561 Pain in right knee: Secondary | ICD-10-CM

## 2020-07-29 DIAGNOSIS — R2689 Other abnormalities of gait and mobility: Secondary | ICD-10-CM

## 2020-07-29 DIAGNOSIS — M25662 Stiffness of left knee, not elsewhere classified: Secondary | ICD-10-CM

## 2020-07-29 DIAGNOSIS — M25562 Pain in left knee: Secondary | ICD-10-CM

## 2020-07-29 DIAGNOSIS — M6281 Muscle weakness (generalized): Secondary | ICD-10-CM

## 2020-07-29 NOTE — Therapy (Signed)
[place], [place], [address] Phone: [phone]   Fax:  [phone]  Physical Therapy Treatment  Patient Details  Name: Logan Mccarthy MRN: [id number] Date of Birth: [birth year] Referring Provider (PT): Leandrew Koyanagi, MD   Encounter Date: 07/29/2020   PT End of Session - 07/29/20 1101    Visit Number 13    Number of Visits 20    Date for PT Re-Evaluation 08/07/20    Authorization Type Self-Pay    PT Start Time 1021    PT Stop Time 1100    PT Time Calculation (min) 39 min    Activity Tolerance Patient tolerated treatment well;No increased pain    Behavior During Therapy WFL for tasks assessed/performed           Past Medical History:  Diagnosis Date  . Avulsion of right patellar tendon   . Back pain   . Hx pulmonary embolism 2016   took xarelto x1 yr  . Hypertension   . Ruptured, tendon, quadriceps, left, initial encounter   . Sleep apnea    does not use CPAP    Past Surgical History:  Procedure Laterality Date  . Heel spurs    . KNEE SURGERY Right   . PATELLAR TENDON REPAIR Right 04/19/2020   Procedure: RIGHT PATELLA TENDON REPAIR;  Surgeon: Leandrew Koyanagi, MD;  Location: [place];  Service: Orthopedics;  Laterality: Right;  . Plantar fascitis    . QUADRICEPS TENDON REPAIR Left 04/19/2020   Procedure: REPAIR LEFT QUADRICEPS TENDON;  Surgeon: Leandrew Koyanagi, MD;  Location: [place];  Service: Orthopedics;  Laterality: Left;    There were no vitals filed for this visit.   Subjective Assessment - 07/29/20 1032    Subjective denies pain, does report soreness, stiffness and weakness in his legs still    Pertinent History PE, SOB, HTN, Rt quad tendon repair    Limitations Standing;Walking;Sitting    Patient Stated Goals be able to return to regular function    Pain Onset More than a month ago              [facility] PT Assessment - 07/29/20 0001      Assessment   Medical  Diagnosis status post left quadriceps tendon repair and right partial patellectomy for patella fracture    Referring Provider (PT) Leandrew Koyanagi, MD    Onset Date/Surgical Date 04/19/20      AROM   Right Knee Flexion 121    Left Knee Flexion 123      PROM   Right Knee Flexion 130    Left Knee Flexion 133      Strength   Right Knee Flexion 4+/5    Right Knee Extension 4+/5    Left Knee Flexion 4+/5    Left Knee Extension 4+/5                         [facility] Adult PT Treatment/Exercise - 07/29/20 0001      Knee/Hip Exercises: Aerobic   Tread Mill 2.2 mph, 5.5% incline 8 min, walking with UE support    Recumbent Bike L10 for 8 min      Knee/Hip Exercises: Machines for Strengthening   Total Gym Leg Press shuttle leg press BLEs 168# 10 reps 3 sets; single leg bil 81# 3x10      Knee/Hip Exercises: Standing   Forward Step Up Limitations step up with alt leg  march, intermit UE support, 6 inch X 10 reps bilat    Functional Squat --    Functional Squat Limitations --    Other Standing Knee Exercises goblet squats to chair with 20 lb KB 2X10                    PT Short Term Goals - 06/18/20 1336      PT SHORT TERM GOAL #1   Title independent with initial HEP    Baseline MET 06/18/2020    Status Achieved    Target Date 06/12/20      PT SHORT TERM GOAL #2   Title improve bil knee flexion to at least 60 deg for improved function    Baseline MET 06/18/2020    Status Achieved    Target Date 06/12/20             PT Long Term Goals - 07/03/20 1152      PT LONG TERM GOAL #1   Title independent with advanced HEP    Status On-going      PT LONG TERM GOAL #2   Title improve bil knee AROM 0-120 for improved function and mobility    Status Achieved      PT LONG TERM GOAL #3   Title amb without AD or significant deviations independently for improved function    Status Achieved      PT LONG TERM GOAL #4   Title report pain < 2/10 for improved function     Status On-going      PT LONG TERM GOAL #5   Title demonstrate 5/5 bil knee strength for improved function    Status On-going                 Plan - 07/29/20 1101    Clinical Impression Statement His ROM measurments are doing really well and strength is progressing as expected. He does still lack overall quad strength and PT will continue to progress this as able.    Personal Factors and Comorbidities Comorbidity 3+    Comorbidities PE, HTN, Rt quad tendon repair    Examination-Activity Limitations Bathing;Sit;Squat;Stairs;Stand;Dressing;Transfers;Hygiene/Grooming;Locomotion Level    Examination-Participation Restrictions Community Activity;Driving;Occupation    Stability/Clinical Decision Making Evolving/Moderate complexity    Rehab Potential Good    PT Frequency 2x / week   1x/wk x 3-4 wks, then 2x/wk x 8 wks   PT Duration 12 weeks    PT Treatment/Interventions ADLs/Self Care Home Management;Cryotherapy;Electrical Stimulation;Moist Heat;Balance training;Therapeutic exercise;Therapeutic activities;Stair training;Gait training;DME Instruction;Ultrasound;Neuromuscular re-education;Patient/family education;Orthotic Fit/Training;Manual techniques;Vasopneumatic Device;Taping;Dry needling;Passive range of motion    PT Next Visit Plan work on quad/hip strength progression and gait/balance; ankle strengthening/gastroc stretching    PT Home Exercise Plan Access Code: [email or web address]    Consulted and Agree with Plan of Care Patient           Patient will benefit from skilled therapeutic intervention in order to improve the following deficits and impairments:  Abnormal gait,Increased edema,Decreased knowledge of use of DME,Decreased strength,Pain,Decreased mobility,Decreased balance,Difficulty walking,Decreased range of motion  Visit Diagnosis: Difficulty walking  Acute pain of left knee  Acute pain of right knee  Stiffness of left knee, not elsewhere classified  Stiffness of right  knee, not elsewhere classified  Muscle weakness (generalized)  Other abnormalities of gait and mobility     Problem List Patient Active Problem List   Diagnosis Date Noted  . Quadriceps tendon rupture, left, initial encounter 05/03/2020  . Rupture, tendon, quadriceps, left, initial encounter  04/19/2020  . Displaced comminuted fracture of right patella, initial encounter for closed fracture 04/19/2020  . Pulmonary embolism without acute cor pulmonale ([place])   . Elevated troponin 06/22/2015  . Chest pain 06/22/2015  . SOB (shortness of breath) 06/22/2015  . AKI (acute kidney injury) ([place]) 06/22/2015  . Essential hypertension   . Back pain   . Sleep apnea     [name] 07/29/2020, 11:02 AM  [facility] Physical Therapy [address] [place], [place], [address] Phone: [phone]   Fax:  [phone]  Name: Logan Mccarthy MRN: [id number] Date of Birth: 1960/10/25

## 2020-07-30 ENCOUNTER — Encounter: Payer: Self-pay | Admitting: Physical Therapy

## 2020-08-02 ENCOUNTER — Encounter: Payer: Self-pay | Admitting: Physical Therapy

## 2020-08-06 ENCOUNTER — Encounter: Payer: Self-pay | Admitting: Physical Therapy

## 2020-08-07 ENCOUNTER — Encounter: Payer: Self-pay | Admitting: Physical Therapy

## 2020-08-12 ENCOUNTER — Encounter: Payer: Self-pay | Admitting: Physical Therapy

## 2020-08-14 ENCOUNTER — Encounter: Payer: Self-pay | Admitting: Physical Therapy

## 2020-08-27 ENCOUNTER — Ambulatory Visit (INDEPENDENT_AMBULATORY_CARE_PROVIDER_SITE_OTHER): Payer: Self-pay | Admitting: Physical Therapy

## 2020-08-27 ENCOUNTER — Encounter: Payer: Self-pay | Admitting: Physical Therapy

## 2020-08-27 ENCOUNTER — Other Ambulatory Visit: Payer: Self-pay

## 2020-08-27 DIAGNOSIS — M6281 Muscle weakness (generalized): Secondary | ICD-10-CM

## 2020-08-27 DIAGNOSIS — M25561 Pain in right knee: Secondary | ICD-10-CM

## 2020-08-27 DIAGNOSIS — R262 Difficulty in walking, not elsewhere classified: Secondary | ICD-10-CM

## 2020-08-27 DIAGNOSIS — M25661 Stiffness of right knee, not elsewhere classified: Secondary | ICD-10-CM

## 2020-08-27 DIAGNOSIS — M25562 Pain in left knee: Secondary | ICD-10-CM

## 2020-08-27 DIAGNOSIS — M25662 Stiffness of left knee, not elsewhere classified: Secondary | ICD-10-CM

## 2020-08-27 NOTE — Therapy (Signed)
[place], [place], [address] Phone: [phone]   Fax:  [phone]  Physical Therapy Treatment  Patient Details  Name: Logan Mccarthy MRN: [id number] Date of Birth: 12-07-1960 Referring Provider (PT): Leandrew Koyanagi, MD   Encounter Date: 08/27/2020   PT End of Session - 08/27/20 1157    Visit Number 14    Number of Visits 20    Date for PT Re-Evaluation 08/07/20    Authorization Type Self-Pay    PT Start Time 1022    PT Stop Time 1106    PT Time Calculation (min) 44 min    Activity Tolerance Patient tolerated treatment well;No increased pain    Behavior During Therapy WFL for tasks assessed/performed           Past Medical History:  Diagnosis Date  . Avulsion of right patellar tendon   . Back pain   . Hx pulmonary embolism 2016   took xarelto x1 yr  . Hypertension   . Ruptured, tendon, quadriceps, left, initial encounter   . Sleep apnea    does not use CPAP    Past Surgical History:  Procedure Laterality Date  . Heel spurs    . KNEE SURGERY Right   . PATELLAR TENDON REPAIR Right 04/19/2020   Procedure: RIGHT PATELLA TENDON REPAIR;  Surgeon: Leandrew Koyanagi, MD;  Location: [place];  Service: Orthopedics;  Laterality: Right;  . Plantar fascitis    . QUADRICEPS TENDON REPAIR Left 04/19/2020   Procedure: REPAIR LEFT QUADRICEPS TENDON;  Surgeon: Leandrew Koyanagi, MD;  Location: [place];  Service: Orthopedics;  Laterality: Left;    There were no vitals filed for this visit.   Subjective Assessment - 08/27/20 1110    Subjective pt. reports no pain, feels good with HEP and walking routine but wants to work toward becoming more independent with stairs    Pertinent History PE, SOB, HTN, Rt quad tendon repair    Limitations Standing;Walking;Sitting    Patient Stated Goals be able to return to regular function    Pain Onset More than a month ago                              [facility] Adult PT Treatment/Exercise - 08/27/20 0001      Knee/Hip Exercises: Aerobic   Tread Mill .6 mph, reverse walking 10 min with UE support    Nustep L10 X 10 min      Knee/Hip Exercises: Machines for Strengthening   Total Gym Leg Press shuttle leg press BLEs 168# 12 reps 3 sets; single leg bil 81# 3x10      Knee/Hip Exercises: Standing   Forward Step Up Limitations step up with alt leg march, intermit UE support, 8 inch X 10 reps bilat, forward step up with opposite leg step down 8in x 10 reps with constant UE support    Other Standing Knee Exercises forward, lateral, and reverse slider mini lunges 5 reps each direction with UE support                    PT Short Term Goals - 06/18/20 1336      PT SHORT TERM GOAL #1   Title independent with initial HEP    Baseline MET 06/18/2020    Status Achieved    Target Date 06/12/20      PT SHORT TERM GOAL #2  Title improve bil knee flexion to at least 60 deg for improved function    Baseline MET 06/18/2020    Status Achieved    Target Date 06/12/20             PT Long Term Goals - 07/03/20 1152      PT LONG TERM GOAL #1   Title independent with advanced HEP    Status On-going      PT LONG TERM GOAL #2   Title improve bil knee AROM 0-120 for improved function and mobility    Status Achieved      PT LONG TERM GOAL #3   Title amb without AD or significant deviations independently for improved function    Status Achieved      PT LONG TERM GOAL #4   Title report pain < 2/10 for improved function    Status On-going      PT LONG TERM GOAL #5   Title demonstrate 5/5 bil knee strength for improved function    Status On-going                 Plan - 08/27/20 1201    Clinical Impression Statement Pt adapted well to increased reps and new exercises. He working towards gaining confidence slowly with quad exercises but still utilizes UE support most of the time-we will work  towards decreased support as strength increases.    Personal Factors and Comorbidities Comorbidity 3+    Comorbidities PE, HTN, Rt quad tendon repair    Examination-Activity Limitations Bathing;Sit;Squat;Stairs;Stand;Dressing;Transfers;Hygiene/Grooming;Locomotion Level    Examination-Participation Restrictions Community Activity;Driving;Occupation    Stability/Clinical Decision Making Evolving/Moderate complexity    Rehab Potential Good    PT Frequency 2x / week   1x/wk x 3-4 wks, then 2x/wk x 8 wks   PT Duration 12 weeks    PT Treatment/Interventions ADLs/Self Care Home Management;Cryotherapy;Electrical Stimulation;Moist Heat;Balance training;Therapeutic exercise;Therapeutic activities;Stair training;Gait training;DME Instruction;Ultrasound;Neuromuscular re-education;Patient/family education;Orthotic Fit/Training;Manual techniques;Vasopneumatic Device;Taping;Dry needling;Passive range of motion    PT Next Visit Plan work on hip strength progression and gait/balance; ankle strengthening/gastroc stretching    PT Home Exercise Plan Access Code: [email or web address]    Consulted and Agree with Plan of Care Patient           Patient will benefit from skilled therapeutic intervention in order to improve the following deficits and impairments:  Abnormal gait,Increased edema,Decreased knowledge of use of DME,Decreased strength,Pain,Decreased mobility,Decreased balance,Difficulty walking,Decreased range of motion  Visit Diagnosis: Difficulty walking  Acute pain of left knee  Acute pain of right knee  Stiffness of left knee, not elsewhere classified  Stiffness of right knee, not elsewhere classified  Muscle weakness (generalized)     Problem List Patient Active Problem List   Diagnosis Date Noted  . Quadriceps tendon rupture, left, initial encounter 05/03/2020  . Rupture, tendon, quadriceps, left, initial encounter 04/19/2020  . Displaced comminuted fracture of right patella, initial encounter  for closed fracture 04/19/2020  . Pulmonary embolism without acute cor pulmonale ([place])   . Elevated troponin 06/22/2015  . Chest pain 06/22/2015  . SOB (shortness of breath) 06/22/2015  . AKI (acute kidney injury) ([place]) 06/22/2015  . Essential hypertension   . Back pain   . Sleep apnea     Debbe Odea, PT,DPT 08/27/2020, 12:08 PM  [facility] Physical Therapy [address] [place], [place], [address] Phone: [phone]   Fax:  [phone]  Name: Rasmus Preusser MRN: [id number] Date of Birth: 1961/03/22

## 2020-08-29 ENCOUNTER — Other Ambulatory Visit: Payer: Self-pay

## 2020-08-29 ENCOUNTER — Ambulatory Visit (INDEPENDENT_AMBULATORY_CARE_PROVIDER_SITE_OTHER): Payer: Self-pay | Admitting: Physical Therapy

## 2020-08-29 DIAGNOSIS — M6281 Muscle weakness (generalized): Secondary | ICD-10-CM

## 2020-08-29 DIAGNOSIS — M25561 Pain in right knee: Secondary | ICD-10-CM

## 2020-08-29 DIAGNOSIS — M25662 Stiffness of left knee, not elsewhere classified: Secondary | ICD-10-CM

## 2020-08-29 DIAGNOSIS — R2689 Other abnormalities of gait and mobility: Secondary | ICD-10-CM

## 2020-08-29 DIAGNOSIS — R262 Difficulty in walking, not elsewhere classified: Secondary | ICD-10-CM

## 2020-08-29 DIAGNOSIS — M25661 Stiffness of right knee, not elsewhere classified: Secondary | ICD-10-CM

## 2020-08-29 DIAGNOSIS — M25562 Pain in left knee: Secondary | ICD-10-CM

## 2020-08-29 NOTE — Therapy (Signed)
[place], [place], [address] Phone: [phone]   Fax:  [phone]  Physical Therapy Treatment  Patient Details  Name: Logan Mccarthy MRN: [id number] Date of Birth: [birth year] Referring Provider (PT): Leandrew Koyanagi, MD   Encounter Date: 08/29/2020   PT End of Session - 08/29/20 1101    Visit Number 15    Number of Visits 20    Date for PT Re-Evaluation 09/26/20    Authorization Type Self-Pay    PT Start Time 1016    PT Stop Time 1059    PT Time Calculation (min) 43 min    Activity Tolerance Patient tolerated treatment well;No increased pain    Behavior During Therapy WFL for tasks assessed/performed           Past Medical History:  Diagnosis Date  . Avulsion of right patellar tendon   . Back pain   . Hx pulmonary embolism 2016   took xarelto x1 yr  . Hypertension   . Ruptured, tendon, quadriceps, left, initial encounter   . Sleep apnea    does not use CPAP    Past Surgical History:  Procedure Laterality Date  . Heel spurs    . KNEE SURGERY Right   . PATELLAR TENDON REPAIR Right 04/19/2020   Procedure: RIGHT PATELLA TENDON REPAIR;  Surgeon: Leandrew Koyanagi, MD;  Location: [place];  Service: Orthopedics;  Laterality: Right;  . Plantar fascitis    . QUADRICEPS TENDON REPAIR Left 04/19/2020   Procedure: REPAIR LEFT QUADRICEPS TENDON;  Surgeon: Leandrew Koyanagi, MD;  Location: [place];  Service: Orthopedics;  Laterality: Left;    There were no vitals filed for this visit.   Subjective Assessment - 08/29/20 1100    Subjective denies soreness or pain in his knees, he does have a toothache however and will go see dentist about this after he leaves session.    Pertinent History PE, SOB, HTN, Rt quad tendon repair    Limitations Standing;Walking;Sitting    Patient Stated Goals be able to return to regular function    Pain Onset More than a month ago            [facility] Adult PT  Treatment/Exercise - 08/29/20 0001      Knee/Hip Exercises: Aerobic   Tread Mill .7 mph, reverse walking 10 min with UE support    Nustep L10 X 10 min      Knee/Hip Exercises: Machines for Strengthening   Cybex Knee Extension 25 lbs 3 sets of 10    Cybex Knee Flexion 55 lbs 3 sets of 10    Total Gym Leg Press shuttle leg press BLEs 168# 12 reps 3 sets; single leg bil 81# 3x10      Knee/Hip Exercises: Standing   Other Standing Knee Exercises forward, lateral, and reverse slider mini lunges 10 reps each direction with UE support                    PT Short Term Goals - 06/18/20 1336      PT SHORT TERM GOAL #1   Title independent with initial HEP    Baseline MET 06/18/2020    Status Achieved    Target Date 06/12/20      PT SHORT TERM GOAL #2   Title improve bil knee flexion to at least 60 deg for improved function    Baseline MET 06/18/2020    Status Achieved    Target  Date 06/12/20             PT Long Term Goals - 07/03/20 1152      PT LONG TERM GOAL #1   Title independent with advanced HEP    Status On-going      PT LONG TERM GOAL #2   Title improve bil knee AROM 0-120 for improved function and mobility    Status Achieved      PT LONG TERM GOAL #3   Title amb without AD or significant deviations independently for improved function    Status Achieved      PT LONG TERM GOAL #4   Title report pain < 2/10 for improved function    Status On-going      PT LONG TERM GOAL #5   Title demonstrate 5/5 bil knee strength for improved function    Status On-going                 Plan - 08/29/20 1103    Clinical Impression Statement He requested to hold on the last couple strengthening exercises due to tooth ache pain and was leaving to go see dentist about this. PT will attempt progress back full strengthening when able.    Personal Factors and Comorbidities Comorbidity 3+    Comorbidities PE, HTN, Rt quad tendon repair    Examination-Activity Limitations  Bathing;Sit;Squat;Stairs;Stand;Dressing;Transfers;Hygiene/Grooming;Locomotion Level    Examination-Participation Restrictions Community Activity;Driving;Occupation    Stability/Clinical Decision Making Evolving/Moderate complexity    Rehab Potential Good    PT Frequency 2x / week   1x/wk x 3-4 wks, then 2x/wk x 8 wks   PT Duration 12 weeks    PT Treatment/Interventions ADLs/Self Care Home Management;Cryotherapy;Electrical Stimulation;Moist Heat;Balance training;Therapeutic exercise;Therapeutic activities;Stair training;Gait training;DME Instruction;Ultrasound;Neuromuscular re-education;Patient/family education;Orthotic Fit/Training;Manual techniques;Vasopneumatic Device;Taping;Dry needling;Passive range of motion    PT Next Visit Plan work on hip/knee strength progression    PT Home Exercise Plan Access Code: [email or web address]    Consulted and Agree with Plan of Care Patient           Patient will benefit from skilled therapeutic intervention in order to improve the following deficits and impairments:  Abnormal gait,Increased edema,Decreased knowledge of use of DME,Decreased strength,Pain,Decreased mobility,Decreased balance,Difficulty walking,Decreased range of motion  Visit Diagnosis: Difficulty walking  Acute pain of left knee  Acute pain of right knee  Stiffness of left knee, not elsewhere classified  Stiffness of right knee, not elsewhere classified  Muscle weakness (generalized)  Other abnormalities of gait and mobility     Problem List Patient Active Problem List   Diagnosis Date Noted  . Quadriceps tendon rupture, left, initial encounter 05/03/2020  . Rupture, tendon, quadriceps, left, initial encounter 04/19/2020  . Displaced comminuted fracture of right patella, initial encounter for closed fracture 04/19/2020  . Pulmonary embolism without acute cor pulmonale ([place])   . Elevated troponin 06/22/2015  . Chest pain 06/22/2015  . SOB (shortness of breath) 06/22/2015  . AKI  (acute kidney injury) ([place]) 06/22/2015  . Essential hypertension   . Back pain   . Sleep apnea     Logan Mccarthy 08/29/2020, 11:13 AM  [facility] Physical Therapy [address] [place], [place], [address] Phone: [phone]   Fax:  [phone]  Name: Logan Mccarthy MRN: [id number] Date of Birth: 12/28/1960

## 2020-09-03 ENCOUNTER — Encounter: Payer: Self-pay | Admitting: Physical Therapy

## 2020-09-05 ENCOUNTER — Encounter: Payer: Self-pay | Admitting: Physical Therapy

## 2020-09-06 ENCOUNTER — Ambulatory Visit: Payer: Self-pay | Admitting: Orthopaedic Surgery

## 2020-09-25 ENCOUNTER — Ambulatory Visit: Payer: Self-pay | Admitting: Orthopaedic Surgery

## 2020-09-30 ENCOUNTER — Encounter: Payer: Self-pay | Admitting: Physical Therapy

## 2020-09-30 ENCOUNTER — Other Ambulatory Visit: Payer: Self-pay

## 2020-09-30 ENCOUNTER — Ambulatory Visit (INDEPENDENT_AMBULATORY_CARE_PROVIDER_SITE_OTHER): Payer: Self-pay | Admitting: Physical Therapy

## 2020-09-30 DIAGNOSIS — M25661 Stiffness of right knee, not elsewhere classified: Secondary | ICD-10-CM

## 2020-09-30 DIAGNOSIS — M25561 Pain in right knee: Secondary | ICD-10-CM

## 2020-09-30 DIAGNOSIS — R2689 Other abnormalities of gait and mobility: Secondary | ICD-10-CM

## 2020-09-30 DIAGNOSIS — R2681 Unsteadiness on feet: Secondary | ICD-10-CM

## 2020-09-30 DIAGNOSIS — M25662 Stiffness of left knee, not elsewhere classified: Secondary | ICD-10-CM

## 2020-09-30 DIAGNOSIS — M6281 Muscle weakness (generalized): Secondary | ICD-10-CM

## 2020-09-30 DIAGNOSIS — M25562 Pain in left knee: Secondary | ICD-10-CM

## 2020-09-30 DIAGNOSIS — R262 Difficulty in walking, not elsewhere classified: Secondary | ICD-10-CM

## 2020-09-30 NOTE — Therapy (Signed)
[facility] [place], [place], [address] Phone: [phone]   Fax:  [phone]  Physical Therapy Treatment/Discharge Summary  Patient Details  Name: Logan Mccarthy MRN: [id number] Date of Birth: May 16, 1961 Referring Provider (PT): Leandrew Koyanagi, MD   Encounter Date: 09/30/2020   PT End of Session - 09/30/20 1126    Visit Number 16    Authorization Type Self-Pay    PT Start Time 9509    PT Stop Time 1058    PT Time Calculation (min) 40 min    Activity Tolerance Patient tolerated treatment well;No increased pain    Behavior During Therapy WFL for tasks assessed/performed           Past Medical History:  Diagnosis Date  . Avulsion of right patellar tendon   . Back pain   . Hx pulmonary embolism 2016   took xarelto x1 yr  . Hypertension   . Ruptured, tendon, quadriceps, left, initial encounter   . Sleep apnea    does not use CPAP    Past Surgical History:  Procedure Laterality Date  . Heel spurs    . KNEE SURGERY Right   . PATELLAR TENDON REPAIR Right 04/19/2020   Procedure: RIGHT PATELLA TENDON REPAIR;  Surgeon: Leandrew Koyanagi, MD;  Location: [facility];  Service: Orthopedics;  Laterality: Right;  . Plantar fascitis    . QUADRICEPS TENDON REPAIR Left 04/19/2020   Procedure: REPAIR LEFT QUADRICEPS TENDON;  Surgeon: Leandrew Koyanagi, MD;  Location: [place] [facility];  Service: Orthopedics;  Laterality: Left;    There were no vitals filed for this visit.   Subjective Assessment - 09/30/20 1019    Subjective no pain, just feels like he has some weakness    Pertinent History PE, SOB, HTN, Rt quad tendon repair    Limitations Standing;Walking;Sitting    Patient Stated Goals be able to return to regular function    Currently in Pain? No/denies              [facility] PT Assessment - 09/30/20 1022      Assessment   Medical Diagnosis status post left quadriceps tendon repair and right partial patellectomy  for patella fracture    Referring Provider (PT) Leandrew Koyanagi, MD    Onset Date/Surgical Date 04/19/20      Strength   Right Knee Flexion 5/5    Right Knee Extension 5/5    Left Knee Flexion 5/5    Left Knee Extension 5/5                         [facility] Adult PT Treatment/Exercise - 09/30/20 1022      Ambulation/Gait   Stairs Yes    Stairs Assistance 6: Modified independent (Device/Increase time)    Stair Management Technique One rail Right;One rail Left;Alternating pattern;Step to pattern;Sideways;Backwards;Forwards    Gait Comments demonstrated going down stairs backwards and discussed alternatives; working on going sideways and trial of forward.  Pt mod I with 1 handrail going forward with alternating pattern, so recommended he work on this at home starting with last 2-3 steps progressing to the full flight as strength and confidence grows.      Knee/Hip Exercises: Aerobic   Nustep L10 X 10 min      Knee/Hip Exercises: Machines for Strengthening   Total Gym Leg Press shuttle leg press BLEs 168# 12 reps 3 sets; single leg bil 81# 3x10  Other Machine squats with 40# with barbell x 10 reps; deadlifts on cable with 50# x 10 reps      Knee/Hip Exercises: Standing   Step Down Right;Left;2 sets;10 reps;Hand Hold: 1;Step Height: 8"    Step Down Limitations lateral                  PT Education - 09/30/20 1126    Education Details HEP and updated gym program    Person(s) Educated Patient    Methods Explanation;Demonstration;Handout    Comprehension Verbalized understanding;Returned demonstration;Need further instruction            PT Short Term Goals - 06/18/20 1336      PT SHORT TERM GOAL #1   Title independent with initial HEP    Baseline MET 06/18/2020    Status Achieved    Target Date 06/12/20      PT SHORT TERM GOAL #2   Title improve bil knee flexion to at least 60 deg for improved function    Baseline MET 06/18/2020    Status Achieved     Target Date 06/12/20             PT Long Term Goals - 09/30/20 1126      PT LONG TERM GOAL #1   Title independent with advanced HEP    Status Achieved      PT LONG TERM GOAL #2   Title improve bil knee AROM 0-120 for improved function and mobility    Status Achieved      PT LONG TERM GOAL #3   Title amb without AD or significant deviations independently for improved function    Status Achieved      PT LONG TERM GOAL #4   Title report pain < 2/10 for improved function    Status Achieved      PT LONG TERM GOAL #5   Title demonstrate 5/5 bil knee strength for improved function    Status Achieved                 Plan - 09/30/20 1127    Clinical Impression Statement Pt returns to PT after missing 1 month due to travel.  He continues to do very well with functional mobility and at this time has met all LTGs and is ready for d/c with transition to gym program.  Will d/c PT today.    Personal Factors and Comorbidities Comorbidity 3+    Comorbidities PE, HTN, Rt quad tendon repair    Examination-Activity Limitations Bathing;Sit;Squat;Stairs;Stand;Dressing;Transfers;Hygiene/Grooming;Locomotion Level    Examination-Participation Restrictions Community Activity;Driving;Occupation    Stability/Clinical Decision Making Evolving/Moderate complexity    Rehab Potential Good    PT Frequency 2x / week   1x/wk x 3-4 wks, then 2x/wk x 8 wks   PT Duration 12 weeks    PT Treatment/Interventions ADLs/Self Care Home Management;Cryotherapy;Electrical Stimulation;Moist Heat;Balance training;Therapeutic exercise;Therapeutic activities;Stair training;Gait training;DME Instruction;Ultrasound;Neuromuscular re-education;Patient/family education;Orthotic Fit/Training;Manual techniques;Vasopneumatic Device;Taping;Dry needling;Passive range of motion    PT Next Visit Plan d/c PT today    PT Home Exercise Plan Access Code: [email or web address]    Consulted and Agree with Plan of Care Patient            Patient will benefit from skilled therapeutic intervention in order to improve the following deficits and impairments:  Abnormal gait,Increased edema,Decreased knowledge of use of DME,Decreased strength,Pain,Decreased mobility,Decreased balance,Difficulty walking,Decreased range of motion  Visit Diagnosis: Difficulty walking  Acute pain of left knee  Acute pain of right knee  Stiffness  of left knee, not elsewhere classified  Stiffness of right knee, not elsewhere classified  Muscle weakness (generalized)  Other abnormalities of gait and mobility  Unsteadiness on feet     Problem List Patient Active Problem List   Diagnosis Date Noted  . Quadriceps tendon rupture, left, initial encounter 05/03/2020  . Rupture, tendon, quadriceps, left, initial encounter 04/19/2020  . Displaced comminuted fracture of right patella, initial encounter for closed fracture 04/19/2020  . Pulmonary embolism without acute cor pulmonale ([facility])   . Elevated troponin 06/22/2015  . Chest pain 06/22/2015  . SOB (shortness of breath) 06/22/2015  . AKI (acute kidney injury) ([place]) 06/22/2015  . Essential hypertension   . Back pain   . Sleep apnea       Laureen Abrahams, PT, DPT 09/30/20 11:29 AM    [facility] Physical Therapy [address] [place], [place], [address] Phone: [phone]   Fax:  [phone]  Name: Logan Mccarthy MRN: [id number] Date of Birth: October 27, 1960     PHYSICAL THERAPY DISCHARGE SUMMARY  Visits from Start of Care: 16  Current functional level related to goals / functional outcomes: See above   Remaining deficits: See above   Education / Equipment: HEP  Plan: Patient agrees to discharge.  Patient goals were met. Patient is being discharged due to meeting the stated rehab goals.  ?????      Laureen Abrahams, PT, DPT 09/30/20 11:29 AM  [facility] Therapy [address] [place], [place],  [address] Phone: [phone]   Fax:  [phone]

## 2020-09-30 NOTE — Patient Instructions (Signed)
Access Code: [email or web address] URL: [email or web address] Date: 09/30/2020 Prepared by: Moshe Cipro  Exercises Sit to Stand without Arm Support - 2 x daily - 6 x weekly - 2-3 sets - 10 reps Partial Lunge with Chair - 2 x daily - 6 x weekly - 2-3 sets - 10 reps Walking March - 2 x daily - 6 x weekly - 3 sets - 10 reps Heel Walking - 2 x daily - 6 x weekly - 3 sets - 10 reps Squat with Counter Support - 1 x daily - 7 x weekly - 2-3 sets - 10 reps - 5 hold Standing Deadlift with Barbell - Knees Straight - 1 x daily - 7 x weekly - 3 sets - 10 reps Wide Stance Half Squat with Barbell - 1 x daily - 7 x weekly - 3 sets - 10 reps Lateral Step Down - 1 x daily - 7 x weekly - 3 sets - 10 reps

## 2020-10-01 ENCOUNTER — Encounter: Payer: Self-pay | Admitting: Orthopaedic Surgery

## 2020-10-01 ENCOUNTER — Ambulatory Visit (INDEPENDENT_AMBULATORY_CARE_PROVIDER_SITE_OTHER): Payer: Self-pay | Admitting: Orthopaedic Surgery

## 2020-10-01 DIAGNOSIS — S76112A Strain of left quadriceps muscle, fascia and tendon, initial encounter: Secondary | ICD-10-CM

## 2020-10-01 DIAGNOSIS — S82041A Displaced comminuted fracture of right patella, initial encounter for closed fracture: Secondary | ICD-10-CM

## 2020-10-01 NOTE — Progress Notes (Signed)
     Patient: Logan Mccarthy           Date of Birth: [birth year]           MRN: [id number] Visit Date: 10/01/2020 PCP: System, Provider Not In   Assessment & Plan:  Chief Complaint:  Chief Complaint  Patient presents with  . Left Knee - Follow-up  . Right Knee - Follow-up   Visit Diagnoses:  1. Rupture, tendon, quadriceps, left, initial encounter   2. Displaced comminuted fracture of right patella, initial encounter for closed fracture   3. Quadriceps tendon rupture, left, initial encounter     Plan:   Logan Mccarthy is left quadriceps repair and right patella tendon repair from September.  He has done very well has progressed quite quickly.  He has finished physical therapy and now doing home exercises.  He has started back at the gym.  He has no complaints.  Bilateral knees show fully healed surgical scars.  He has excellent range of motion and strength without pain.  From my standpoint doing is doing really well with his recovery.  He will continue to work on losing weight and strengthening.  We will see him back as needed.  Follow-Up Instructions: No follow-ups on file.   Orders:  No orders of the defined types were placed in this encounter.  No orders of the defined types were placed in this encounter.   Imaging: No results found.  PMFS History: Patient Active Problem List   Diagnosis Date Noted  . Quadriceps tendon rupture, left, initial encounter 05/03/2020  . Rupture, tendon, quadriceps, left, initial encounter 04/19/2020  . Displaced comminuted fracture of right patella, initial encounter for closed fracture 04/19/2020  . Pulmonary embolism without acute cor pulmonale ([facility])   . Elevated troponin 06/22/2015  . Chest pain 06/22/2015  . SOB (shortness of breath) 06/22/2015  . AKI (acute kidney injury) ([facility]) 06/22/2015  . Essential hypertension   . Back pain   . Sleep apnea    Past Medical History:  Diagnosis Date  . Avulsion of right patellar tendon   . Back pain    . Hx pulmonary embolism 2016   took xarelto x1 yr  . Hypertension   . Ruptured, tendon, quadriceps, left, initial encounter   . Sleep apnea    does not use CPAP    Family History  Problem Relation Age of Onset  . Hypertension Mother     Past Surgical History:  Procedure Laterality Date  . Heel spurs    . KNEE SURGERY Right   . PATELLAR TENDON REPAIR Right 04/19/2020   Procedure: RIGHT PATELLA TENDON REPAIR;  Surgeon: Tarry Kos, MD;  Location: [facility];  Service: Orthopedics;  Laterality: Right;  . Plantar fascitis    . QUADRICEPS TENDON REPAIR Left 04/19/2020   Procedure: REPAIR LEFT QUADRICEPS TENDON;  Surgeon: Tarry Kos, MD;  Location: [facility];  Service: Orthopedics;  Laterality: Left;   Social History   Occupational History  . Occupation: DJ  Tobacco Use  . Smoking status: Never Smoker  . Smokeless tobacco: Never Used  Substance and Sexual Activity  . Alcohol use: No  . Drug use: No  . Sexual activity: Yes    Birth control/protection: Condom

## 2020-10-28 ENCOUNTER — Telehealth: Payer: Self-pay | Admitting: Orthopaedic Surgery

## 2020-10-28 NOTE — Telephone Encounter (Signed)
Patient called. He would like to start PT again. Would like a new referral put in Epic to for PT. His call back number is [phone]

## 2020-10-28 NOTE — Telephone Encounter (Signed)
Yes, thanks

## 2020-10-28 NOTE — Telephone Encounter (Signed)
Ok to put new referral?

## 2020-10-30 ENCOUNTER — Other Ambulatory Visit: Payer: Self-pay

## 2020-10-30 DIAGNOSIS — S76112A Strain of left quadriceps muscle, fascia and tendon, initial encounter: Secondary | ICD-10-CM

## 2020-10-30 DIAGNOSIS — S82041A Displaced comminuted fracture of right patella, initial encounter for closed fracture: Secondary | ICD-10-CM

## 2020-10-30 NOTE — Telephone Encounter (Signed)
Order made

## 2020-11-11 ENCOUNTER — Encounter: Payer: Self-pay | Admitting: Rehabilitative and Restorative Service Providers"

## 2020-11-11 ENCOUNTER — Other Ambulatory Visit: Payer: Self-pay

## 2020-11-11 ENCOUNTER — Ambulatory Visit (INDEPENDENT_AMBULATORY_CARE_PROVIDER_SITE_OTHER): Payer: Self-pay | Admitting: Rehabilitative and Restorative Service Providers"

## 2020-11-11 DIAGNOSIS — M25561 Pain in right knee: Secondary | ICD-10-CM

## 2020-11-11 DIAGNOSIS — M6281 Muscle weakness (generalized): Secondary | ICD-10-CM

## 2020-11-11 DIAGNOSIS — M25562 Pain in left knee: Secondary | ICD-10-CM

## 2020-11-11 DIAGNOSIS — R262 Difficulty in walking, not elsewhere classified: Secondary | ICD-10-CM

## 2020-11-11 DIAGNOSIS — G8929 Other chronic pain: Secondary | ICD-10-CM

## 2020-11-11 NOTE — Patient Instructions (Signed)
Access Code: [email or web address] URL: [email or web address] Date: 11/11/2020 Prepared by: Chyrel Masson  Exercises Sit to Stand without Arm Support - 2 x daily - 6 x weekly - 2-3 sets - 10 reps Partial Lunge with Chair - 2 x daily - 6 x weekly - 2-3 sets - 10 reps Walking March - 2 x daily - 6 x weekly - 3 sets - 10 reps Heel Walking - 2 x daily - 6 x weekly - 3 sets - 10 reps Squat with Counter Support - 1 x daily - 7 x weekly - 2-3 sets - 10 reps - 5 hold Standing Deadlift with Barbell - Knees Straight - 1 x daily - 7 x weekly - 3 sets - 10 reps Wide Stance Half Squat with Barbell - 1 x daily - 7 x weekly - 3 sets - 10 reps Lateral Step Down - 1 x daily - 7 x weekly - 3 sets - 10 reps

## 2020-11-11 NOTE — Therapy (Signed)
[facility] [address] [place], [place], [address] Phone: [phone]   Fax:  [phone]  Physical Therapy Evaluation  Patient Details  Name: Logan Mccarthy MRN: [id number] Date of Birth: 11/22/1960 Referring Provider (PT): Jari SportsmanMary Stanbery PA-C   Encounter Date: 11/11/2020   PT End of Session - 11/11/20 1004    Visit Number 1    Number of Visits 12    Date for PT Re-Evaluation 01/20/21    Authorization Type Self-Pay    PT Start Time [id number]    PT Stop Time 1010    PT Time Calculation (min) 33 min    Activity Tolerance Patient tolerated treatment well;No increased pain    Behavior During Therapy WFL for tasks assessed/performed           Past Medical History:  Diagnosis Date  . Avulsion of right patellar tendon   . Back pain   . Hx pulmonary embolism 2016   took xarelto x1 yr  . Hypertension   . Ruptured, tendon, quadriceps, left, initial encounter   . Sleep apnea    does not use CPAP    Past Surgical History:  Procedure Laterality Date  . Heel spurs    . KNEE SURGERY Right   . PATELLAR TENDON REPAIR Right 04/19/2020   Procedure: RIGHT PATELLA TENDON REPAIR;  Surgeon: Tarry KosXu, Naiping M, MD;  Location: [facility];  Service: Orthopedics;  Laterality: Right;  . Plantar fascitis    . QUADRICEPS TENDON REPAIR Left 04/19/2020   Procedure: REPAIR LEFT QUADRICEPS TENDON;  Surgeon: Tarry KosXu, Naiping M, MD;  Location: [facility];  Service: Orthopedics;  Laterality: Left;    There were no vitals filed for this visit.    Subjective Assessment - 11/11/20 0941    Subjective Pt. indicated he felt like he still had weakness and difficulty c functional activity (stairs, squats, walking, curb stepping). Pt. indicated pain up to 6/10 in Rt knee more than Lt knee.    Pertinent History PE, SOB, HTN, Rt quad tendon repair,    Limitations Sitting;Standing;Walking    Patient Stated Goals Reduce pain, get back to normal    Currently in  Pain? Yes    Pain Score 6     Pain Location Knee    Pain Orientation Right;Left    Pain Descriptors / Indicators Sharp    Pain Type Chronic pain    Pain Onset More than a month ago    Pain Frequency Intermittent    Aggravating Factors  walking prolonged, standing after sitting prolonged, stairs    Pain Relieving Factors rest              [facility] PT Assessment - 11/11/20 0001      Assessment   Medical Diagnosis status post left quadriceps tendon repair and right partial patellectomy for patella fracture    Referring Provider (PT) Jari SportsmanMary Stanbery PA-C    Onset Date/Surgical Date 04/19/20      Precautions   Precautions None      Restrictions   Weight Bearing Restrictions No      Balance Screen   Has the patient fallen in the past 6 months No    Has the patient had a decrease in activity level because of a fear of falling?  No    Is the patient reluctant to leave their home because of a fear of falling?  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)      Cognition   Overall Cognitive Status Within Functional Limits for tasks assessed      Observation/Other Assessments   Focus on Therapeutic Outcomes (FOTO)  intake 48%, expected outcome 52 %      Functional Tests   Functional tests Single leg stance;Step down      Step Down   Comments poor control/unable to perform fwd step down standard height (7-8 inches) unassisted by UE for either LE      Single Leg Stance   Comments < 3 seconds bilateral      AROM   AROM Assessment Site Other (comment)   WFL knee bilateral     Strength   Overall Strength  Comments Dynamometry recorded in sitting MMT positioning as noted below    Right Knee Flexion 5/5   48 lbs, 49.2 lbs   Right Knee Extension 4+/5   57.5 lbs, 57.4 lbs   Left Knee Flexion 5/5   44.5 lbs, 37.3 lbs   Left Knee Extension 5/5   62.8 lbs, 58.7 lbs     Transfers   Five time sit to stand comments  14.16 seconds    Comments no UE use                      Objective measurements completed on examination: See above findings.       [facility] Adult PT Treatment/Exercise - 11/11/20 0001      Exercises   Exercises Other Exercises    Other Exercises  HEP instruction/review c verbal cues for handout provided.  See pt. instructions      Knee/Hip Exercises: Aerobic   Nustep Lvl 6 10 mins - LE only      Knee/Hip Exercises: Machines for Strengthening   Total Gym Leg Press shuttle leg press bilateral 168# 15 reps 3 sets; single leg bil 81# 2 x 15                  PT Education - 11/11/20 1004    Education Details Review of previous HEP, printout provided    Person(s) Educated Patient    Methods Explanation;Demonstration;Handout;Verbal cues    Comprehension Verbalized understanding;Returned demonstration            PT Short Term Goals - 11/11/20 1010      PT SHORT TERM GOAL #1   Title Patient will demonstrate independent use of home exercise program to maintain progress from in clinic treatments.    Time 3    Period Weeks    Status New    Target Date 12/02/20             PT Long Term Goals - 11/11/20 1008      PT LONG TERM GOAL #1   Title Patient will demonstrate/report pain at worst less than or equal to 2/10 to facilitate minimal limitation in daily activity secondary to pain symptoms.    Time 8    Period Weeks    Status New    Target Date 01/06/21      PT LONG TERM  GOAL #2   Title Patient will demonstrate independent use of home exercise program to facilitate ability to maintain/progress functional gains from skilled physical therapy  services.    Time 8    Period Weeks    Status New    Target Date 01/06/21      PT LONG TERM GOAL #3   Title Pt. will demonstrate bilateral SLS > 15 seconds for improved stability in ambulation on uneven surfaces.    Time 8    Period Weeks    Status New    Target Date 01/06/21      PT LONG TERM GOAL #4   Title Pt. will demonstrate ability to ascend/descend stairs for entry to apartment c reciprocal gait pattern s UE assist.    Time 8    Period Weeks    Status New    Target Date 01/06/21      PT LONG TERM GOAL #5   Title Pt. will demonstrate FOTO outcome > =    Time 8    Period Weeks    Status New    Target Date 01/06/21                  Plan - 11/11/20 1010    Clinical Impression Statement Patient is a 60 y.o. who comes to clinic with complaints of bilateral knee pain (history of Lt quad repair, Rt patellar tendon repair 04/19/2020 with history of therapy post surgery here at clinic) with strength and movement coordination deficits that impair their ability to perform usual daily and recreational functional activities without increase difficulty/symptoms at this time.  Patient to benefit from skilled PT services to address impairments and limitations to improve to previous level of function without restriction secondary to condition.    Personal Factors and Comorbidities Comorbidity 2    Comorbidities PE, HTN    Examination-Activity Limitations Bathing;Sit;Squat;Stairs;Stand;Dressing;Transfers;Hygiene/Grooming;Locomotion Level;Bend    Examination-Participation Restrictions Community Activity;Driving;Occupation    Stability/Clinical Decision Making Evolving/Moderate complexity    Clinical Decision Making Moderate    Rehab Potential Good    PT Frequency --   1-2x/week   PT Duration 8 weeks    PT Treatment/Interventions ADLs/Self Care Home Management;Cryotherapy;Electrical Stimulation;Moist Heat;Balance training;Therapeutic exercise;Therapeutic activities;Stair training;Gait  training;DME Instruction;Ultrasound;Neuromuscular re-education;Patient/family education;Orthotic Fit/Training;Manual techniques;Vasopneumatic Device;Taping;Dry needling;Passive range of motion;Iontophoresis 4mg /ml Dexamethasone;Functional mobility training    PT Next Visit Plan Continue to improve LE strength, balance on non compliant and compliant surfaces, step down control.    PT Home Exercise Plan Access Code:    Consulted and Agree with Plan of Care Patient           Patient will benefit from skilled therapeutic intervention in order to improve the following deficits and impairments:  Abnormal gait,Increased edema,Decreased knowledge of use of DME,Decreased strength,Pain,Decreased mobility,Decreased balance,Difficulty walking,Decreased range of motion,Decreased endurance,Decreased activity tolerance,Impaired perceived functional ability,Improper body mechanics,Decreased coordination  Visit Diagnosis: Chronic pain of left knee  Chronic pain of right knee  Muscle weakness (generalized)  Difficulty in walking, not elsewhere classified     Problem List Patient Active Problem List   Diagnosis Date Noted  . Quadriceps tendon rupture, left, initial encounter 05/03/2020  . Rupture, tendon, quadriceps, left, initial encounter 04/19/2020  . Displaced comminuted fracture of right patella, initial encounter for closed fracture 04/19/2020  . Pulmonary embolism without acute cor pulmonale ([facility])   . Elevated troponin 06/22/2015  . Chest pain 06/22/2015  . SOB (shortness of breath) 06/22/2015  . AKI (acute kidney injury) ([facility]) 06/22/2015  .  Essential hypertension   . Back pain   . Sleep apnea     Chyrel Masson, PT, DPT, OCS, ATC 11/11/20  11:43 AM    [facility] Therapy [address] [place], [place], [address] Phone: [phone]   Fax:  [phone]  Name: Logan Mccarthy MRN: [id number] Date of Birth: 1960/12/29

## 2020-11-20 ENCOUNTER — Encounter: Payer: Self-pay | Admitting: Rehabilitative and Restorative Service Providers"

## 2020-11-25 ENCOUNTER — Other Ambulatory Visit: Payer: Self-pay

## 2020-11-25 ENCOUNTER — Ambulatory Visit (INDEPENDENT_AMBULATORY_CARE_PROVIDER_SITE_OTHER): Payer: Self-pay | Admitting: Physical Therapy

## 2020-11-25 DIAGNOSIS — M6281 Muscle weakness (generalized): Secondary | ICD-10-CM

## 2020-11-25 DIAGNOSIS — M25562 Pain in left knee: Secondary | ICD-10-CM

## 2020-11-25 DIAGNOSIS — R262 Difficulty in walking, not elsewhere classified: Secondary | ICD-10-CM

## 2020-11-25 DIAGNOSIS — G8929 Other chronic pain: Secondary | ICD-10-CM

## 2020-11-25 DIAGNOSIS — M25561 Pain in right knee: Secondary | ICD-10-CM

## 2020-11-25 NOTE — Therapy (Signed)
[facility] Physical Therapy [address] [place], [place], [address] Phone: [phone]   Fax:  [phone]  Physical Therapy Treatment  Patient Details  Name: Logan Mccarthy MRN: [id number] Date of Birth: March 19, 1961 Referring Provider (PT): Jari Sportsman PA-C   Encounter Date: 11/25/2020   PT End of Session - 11/25/20 1052    Visit Number 2    Number of Visits 12    Date for PT Re-Evaluation 01/20/21    Authorization Type Self-Pay    PT Start Time 1015    PT Stop Time 1050   he requests to leave early for another appt   PT Time Calculation (min) 35 min    Activity Tolerance Patient tolerated treatment well;No increased pain    Behavior During Therapy WFL for tasks assessed/performed           Past Medical History:  Diagnosis Date  . Avulsion of right patellar tendon   . Back pain   . Hx pulmonary embolism 2016   took xarelto x1 yr  . Hypertension   . Ruptured, tendon, quadriceps, left, initial encounter   . Sleep apnea    does not use CPAP    Past Surgical History:  Procedure Laterality Date  . Heel spurs    . KNEE SURGERY Right   . PATELLAR TENDON REPAIR Right 04/19/2020   Procedure: RIGHT PATELLA TENDON REPAIR;  Surgeon: Tarry Kos, MD;  Location: [facility];  Service: Orthopedics;  Laterality: Right;  . Plantar fascitis    . QUADRICEPS TENDON REPAIR Left 04/19/2020   Procedure: REPAIR LEFT QUADRICEPS TENDON;  Surgeon: Tarry Kos, MD;  Location:  S[facility]ERY [facility];  Service: Orthopedics;  Laterality: Left;    There were no vitals filed for this visit.   Subjective Assessment - 11/25/20 1050    Subjective No pain to report, he just still complaints of weakness and difficulty with stairs.    Pertinent History PE, SOB, HTN, Rt quad tendon repair,    Limitations Sitting;Standing;Walking    Patient Stated Goals Reduce pain, get back to normal    Pain Onset More than a month ago            [facility] Adult PT  Treatment/Exercise - 11/25/20 0001      Knee/Hip Exercises: Aerobic   Nustep Lvl 10 for 10 mins - LE only      Knee/Hip Exercises: Machines for Strengthening   Total Gym Leg Press shuttle leg press bilateral 168# 15 reps 3 sets; single leg bil 100# 2X10 (he requests more weight next time)      Knee/Hip Exercises: Standing   Forward Step Up Both;15 reps;Hand Hold: 0;Step Height: 6"    Forward Step Up Limitations with alt leg march    Other Standing Knee Exercises heel and toe raises bilat holding 2 sec 3X10                    PT Short Term Goals - 11/11/20 1010      PT SHORT TERM GOAL #1   Title Patient will demonstrate independent use of home exercise program to maintain progress from in clinic treatments.    Time 3    Period Weeks    Status New    Target Date 12/02/20             PT Long Term Goals - 11/11/20 1008      PT LONG TERM GOAL #1   Title Patient will demonstrate/report pain at  worst less than or equal to 2/10 to facilitate minimal limitation in daily activity secondary to pain symptoms.    Time 8    Period Weeks    Status New    Target Date 01/06/21      PT LONG TERM GOAL #2   Title Patient will demonstrate independent use of home exercise program to facilitate ability to maintain/progress functional gains from skilled physical therapy services.    Time 8    Period Weeks    Status New    Target Date 01/06/21      PT LONG TERM GOAL #3   Title Pt. will demonstrate bilateral SLS > 15 seconds for improved stability in ambulation on uneven surfaces.    Time 8    Period Weeks    Status New    Target Date 01/06/21      PT LONG TERM GOAL #4   Title Pt. will demonstrate ability to ascend/descend stairs for entry to apartment c reciprocal gait pattern s UE assist.    Time 8    Period Weeks    Status New    Target Date 01/06/21      PT LONG TERM GOAL #5   Title Pt. will demonstrate FOTO outcome > =    Time 8    Period Weeks    Status New     Target Date 01/06/21                 Plan - 11/25/20 1053    Clinical Impression Statement Focused on quad strength and ankle strength today along with single leg stance stability. Good overall tolerance to exercises and he even requests to perform more resistance on SL leg press next visit.    Personal Factors and Comorbidities Comorbidity 2    Comorbidities PE, HTN    Examination-Activity Limitations Bathing;Sit;Squat;Stairs;Stand;Dressing;Transfers;Hygiene/Grooming;Locomotion Level;Bend    Examination-Participation Restrictions Community Activity;Driving;Occupation    Stability/Clinical Decision Making Evolving/Moderate complexity    Rehab Potential Good    PT Frequency --   1-2x/week   PT Duration 8 weeks    PT Treatment/Interventions ADLs/Self Care Home Management;Cryotherapy;Electrical Stimulation;Moist Heat;Balance training;Therapeutic exercise;Therapeutic activities;Stair training;Gait training;DME Instruction;Ultrasound;Neuromuscular re-education;Patient/family education;Orthotic Fit/Training;Manual techniques;Vasopneumatic Device;Taping;Dry needling;Passive range of motion;Iontophoresis 4mg /ml Dexamethasone;Functional mobility training    PT Next Visit Plan Continue to improve LE strength, balance on non compliant and compliant surfaces, step down control.    PT Home Exercise Plan Access Code:    Consulted and Agree with Plan of Care Patient           Patient will benefit from skilled therapeutic intervention in order to improve the following deficits and impairments:  Abnormal gait,Increased edema,Decreased knowledge of use of DME,Decreased strength,Pain,Decreased mobility,Decreased balance,Difficulty walking,Decreased range of motion,Decreased endurance,Decreased activity tolerance,Impaired perceived functional ability,Improper body mechanics,Decreased coordination  Visit Diagnosis: Chronic pain of left knee  Chronic pain of right knee  Muscle weakness  (generalized)  Difficulty in walking, not elsewhere classified     Problem List Patient Active Problem List   Diagnosis Date Noted  . Quadriceps tendon rupture, left, initial encounter 05/03/2020  . Rupture, tendon, quadriceps, left, initial encounter 04/19/2020  . Displaced comminuted fracture of right patella, initial encounter for closed fracture 04/19/2020  . Pulmonary embolism without acute cor pulmonale (HCC)   . Elevated troponin 06/22/2015  . Chest pain 06/22/2015  . SOB (shortness of breath) 06/22/2015  . AKI (acute kidney injury) (HCC) 06/22/2015  . Essential hypertension   . Back pain   . Sleep  apnea     Birdie Riddle 11/25/2020, 10:54 AM  [facility] Physical Therapy [address] [place], [place], [address] Phone: [phone]   Fax:  [phone]  Name: Logan Mccarthy MRN: [id number] Date of Birth: 10/12/1960

## 2020-11-27 ENCOUNTER — Telehealth: Payer: Self-pay | Admitting: Rehabilitative and Restorative Service Providers"

## 2020-11-27 ENCOUNTER — Encounter: Payer: Self-pay | Admitting: Rehabilitative and Restorative Service Providers"

## 2020-11-27 NOTE — Telephone Encounter (Signed)
Called after 20 mins no show for appointment.  Left message c reminder of next appointment time.  Chyrel Masson, PT, DPT, OCS, ATC 11/27/20  9:05 AM

## 2020-12-09 ENCOUNTER — Other Ambulatory Visit: Payer: Self-pay

## 2020-12-09 ENCOUNTER — Ambulatory Visit (INDEPENDENT_AMBULATORY_CARE_PROVIDER_SITE_OTHER): Payer: Self-pay | Admitting: Rehabilitative and Restorative Service Providers"

## 2020-12-09 ENCOUNTER — Encounter: Payer: Self-pay | Admitting: Rehabilitative and Restorative Service Providers"

## 2020-12-09 DIAGNOSIS — M25561 Pain in right knee: Secondary | ICD-10-CM

## 2020-12-09 DIAGNOSIS — M25562 Pain in left knee: Secondary | ICD-10-CM

## 2020-12-09 DIAGNOSIS — R262 Difficulty in walking, not elsewhere classified: Secondary | ICD-10-CM

## 2020-12-09 DIAGNOSIS — M6281 Muscle weakness (generalized): Secondary | ICD-10-CM

## 2020-12-09 DIAGNOSIS — G8929 Other chronic pain: Secondary | ICD-10-CM

## 2020-12-09 NOTE — Therapy (Addendum)
[facility] Physical Therapy [address] [place], [place], [address] Phone: [phone]   Fax:  [phone]  Physical Therapy Treatment  Patient Details  Name: Logan Mccarthy MRN: [id number] Date of Birth: Oct 16, 1960 Referring Provider (PT): Jari Sportsman PA-C   Encounter Date: 12/09/2020   PT End of Session - 12/09/20 0945    Visit Number 3    Number of Visits 12    Date for PT Re-Evaluation 01/20/21    Authorization Type Self-Pay    PT Start Time 4010    PT Stop Time 1015    PT Time Calculation (min) 38 min    Activity Tolerance Patient tolerated treatment well;No increased pain    Behavior During Therapy WFL for tasks assessed/performed           Past Medical History:  Diagnosis Date  . Avulsion of right patellar tendon   . Back pain   . Hx pulmonary embolism 2016   took xarelto x1 yr  . Hypertension   . Ruptured, tendon, quadriceps, left, initial encounter   . Sleep apnea    does not use CPAP    Past Surgical History:  Procedure Laterality Date  . Heel spurs    . KNEE SURGERY Right   . PATELLAR TENDON REPAIR Right 04/19/2020   Procedure: RIGHT PATELLA TENDON REPAIR;  Surgeon: Tarry Kos, MD;  Location: [facility];  Service: Orthopedics;  Laterality: Right;  . Plantar fascitis    . QUADRICEPS TENDON REPAIR Left 04/19/2020   Procedure: REPAIR LEFT QUADRICEPS TENDON;  Surgeon: Tarry Kos, MD;  Location: [facility];  Service: Orthopedics;  Laterality: Left;    There were no vitals filed for this visit.   Subjective Assessment - 12/09/20 0944    Subjective Pt. indicated no pain and feeling like he can walk better than before.    Pertinent History PE, SOB, HTN, Rt quad tendon repair,    Limitations Sitting;Standing;Walking    Patient Stated Goals Reduce pain, get back to normal    Currently in Pain? No/denies    Pain Score 0-No pain    Pain Onset More than a month ago              [facility] PT Assessment -  12/09/20 0001      Assessment   Medical Diagnosis status post left quadriceps tendon repair and right partial patellectomy for patella fracture    Referring Provider (PT) Jari Sportsman PA-C    Onset Date/Surgical Date 04/19/20      Strength   Right Knee Extension 5/5   61.3, 68.1 lbs   Left Knee Extension 5/5   78 78.5 lbs                        [facility] Adult PT Treatment/Exercise - 12/09/20 0001      Neuro Re-ed    Neuro Re-ed Details  lateral stepping 3 cones x 6 each way bilateral      Exercises   Exercises Knee/Hip      Knee/Hip Exercises: Stretches   Gastroc Stretch 5 reps;30 seconds;Both   incline board     Knee/Hip Exercises: Machines for Strengthening   Total Gym Leg Press double leg 2 x 20 168 lbs, SL 112 lbs performed bilateral 2 x 15 each      Knee/Hip Exercises: Standing   Lateral Step Up Step Height: 6";15 reps;2 sets;Both   step down focus, heel touch   Forward Step  Up Both;15 reps;Step Height: 6";Hand Hold: 1    Forward Step Up Limitations with alt leg march    Other Standing Knee Exercises squat over 18 inch chair c airex pad touch and stand 2 x10 eccentric control    Other Standing Knee Exercises heel and toe raises bilat holding 2 sec 20x                    PT Short Term Goals - 12/09/20 0951      PT SHORT TERM GOAL #1   Title Patient will demonstrate independent use of home exercise program to maintain progress from in clinic treatments.    Time 3    Period Weeks    Status Achieved    Target Date 12/02/20             PT Long Term Goals - 12/09/20 0951      PT LONG TERM GOAL #1   Title Patient will demonstrate/report pain at worst less than or equal to 2/10 to facilitate minimal limitation in daily activity secondary to pain symptoms.    Time 8    Period Weeks    Status Achieved      PT LONG TERM GOAL #2   Title Patient will demonstrate independent use of home exercise program to facilitate ability to maintain/progress  functional gains from skilled physical therapy services.    Time 8    Period Weeks    Status On-going      PT LONG TERM GOAL #3   Title Pt. will demonstrate bilateral SLS > 15 seconds for improved stability in ambulation on uneven surfaces.    Time 8    Period Weeks    Status On-going      PT LONG TERM GOAL #4   Title Pt. will demonstrate ability to ascend/descend stairs for entry to apartment c reciprocal gait pattern s UE assist.    Time 8    Period Weeks    Status On-going      PT LONG TERM GOAL #5   Title Pt. will demonstrate FOTO outcome > = 52%    Time 8    Period Weeks    Status On-going                 Plan - 12/09/20 1001    Clinical Impression Statement Pt. demonstrated improvement in bilateral knee extension strength testing today as documented.  Continued focus on stair activity including eccentric lowering control to improve that chief complaint.    Personal Factors and Comorbidities Comorbidity 2    Comorbidities PE, HTN    Examination-Activity Limitations Bathing;Sit;Squat;Stairs;Stand;Dressing;Transfers;Hygiene/Grooming;Locomotion Level;Bend    Examination-Participation Restrictions Community Activity;Driving;Occupation    Stability/Clinical Decision Making Evolving/Moderate complexity    Rehab Potential Good    PT Frequency --   1-2x/week   PT Duration 8 weeks    PT Treatment/Interventions ADLs/Self Care Home Management;Cryotherapy;Electrical Stimulation;Moist Heat;Balance training;Therapeutic exercise;Therapeutic activities;Stair training;Gait training;DME Instruction;Ultrasound;Neuromuscular re-education;Patient/family education;Orthotic Fit/Training;Manual techniques;Vasopneumatic Device;Taping;Dry needling;Passive range of motion;Iontophoresis 4mg /ml Dexamethasone;Functional mobility training    PT Next Visit Plan Eccentric step down control, squat control.    PT Home Exercise Plan Access Code:    Consulted and Agree with Plan of Care Patient            Patient will benefit from skilled therapeutic intervention in order to improve the following deficits and impairments:  Abnormal gait,Increased edema,Decreased knowledge of use of DME,Decreased strength,Pain,Decreased mobility,Decreased balance,Difficulty walking,Decreased range of motion,Decreased endurance,Decreased activity tolerance,Impaired perceived  functional ability,Improper body mechanics,Decreased coordination  Visit Diagnosis: Chronic pain of left knee  Chronic pain of right knee  Muscle weakness (generalized)  Difficulty in walking, not elsewhere classified     Problem List Patient Active Problem List   Diagnosis Date Noted  . Quadriceps tendon rupture, left, initial encounter 05/03/2020  . Rupture, tendon, quadriceps, left, initial encounter 04/19/2020  . Displaced comminuted fracture of right patella, initial encounter for closed fracture 04/19/2020  . Pulmonary embolism without acute cor pulmonale (HCC)   . Elevated troponin 06/22/2015  . Chest pain 06/22/2015  . SOB (shortness of breath) 06/22/2015  . AKI (acute kidney injury) (HCC) 06/22/2015  . Essential hypertension   . Back pain   . Sleep apnea    Chyrel Masson, PT, DPT, OCS, ATC 12/09/20  10:09 AM    [facility] Physical Therapy [address] [place], [place], [address] Phone: [phone]   Fax:  [phone]  Name: Rishi Vicario MRN: [id number] Date of Birth: May 14, 1961

## 2020-12-11 ENCOUNTER — Encounter: Payer: Self-pay | Admitting: Rehabilitative and Restorative Service Providers"

## 2020-12-16 ENCOUNTER — Encounter: Payer: Self-pay | Admitting: Rehabilitative and Restorative Service Providers"

## 2020-12-16 ENCOUNTER — Telehealth: Payer: Self-pay | Admitting: Rehabilitative and Restorative Service Providers"

## 2020-12-16 NOTE — Telephone Encounter (Signed)
Called patient and left message about no show for today's appointment.  Pt. Has one more visit scheduled, reminded him of the time.  Chyrel Masson, PT, DPT, OCS, ATC 12/16/20  9:50 AM

## 2020-12-18 ENCOUNTER — Ambulatory Visit (INDEPENDENT_AMBULATORY_CARE_PROVIDER_SITE_OTHER): Payer: Self-pay | Admitting: Rehabilitative and Restorative Service Providers"

## 2020-12-18 ENCOUNTER — Encounter: Payer: Self-pay | Admitting: Rehabilitative and Restorative Service Providers"

## 2020-12-18 ENCOUNTER — Other Ambulatory Visit: Payer: Self-pay

## 2020-12-18 DIAGNOSIS — M25562 Pain in left knee: Secondary | ICD-10-CM

## 2020-12-18 DIAGNOSIS — M25561 Pain in right knee: Secondary | ICD-10-CM

## 2020-12-18 DIAGNOSIS — M6281 Muscle weakness (generalized): Secondary | ICD-10-CM

## 2020-12-18 DIAGNOSIS — R262 Difficulty in walking, not elsewhere classified: Secondary | ICD-10-CM

## 2020-12-18 DIAGNOSIS — G8929 Other chronic pain: Secondary | ICD-10-CM

## 2020-12-18 NOTE — Therapy (Addendum)
[facility] Physical Therapy [address] [place], [place], [address] Phone: [phone]   Fax:  [phone]  Physical Therapy Treatment/Discharge  Patient Details  Name: Logan Mccarthy MRN: [id number] Date of Birth: [birth year] Referring Provider (PT): Dwana Melena PA-C   Encounter Date: 12/18/2020   PT End of Session - 12/18/20 0935    Visit Number 4    Number of Visits 12    Date for PT Re-Evaluation 01/20/21    Authorization Type Self-Pay    PT Start Time 0930    PT Stop Time 1010    PT Time Calculation (min) 40 min    Activity Tolerance Patient tolerated treatment well;No increased pain    Behavior During Therapy WFL for tasks assessed/performed           Past Medical History:  Diagnosis Date  . Avulsion of right patellar tendon   . Back pain   . Hx pulmonary embolism 2016   took xarelto x1 yr  . Hypertension   . Ruptured, tendon, quadriceps, left, initial encounter   . Sleep apnea    does not use CPAP    Past Surgical History:  Procedure Laterality Date  . Heel spurs    . KNEE SURGERY Right   . PATELLAR TENDON REPAIR Right 04/19/2020   Procedure: RIGHT PATELLA TENDON REPAIR;  Surgeon: Leandrew Koyanagi, MD;  Location: [place];  Service: Orthopedics;  Laterality: Right;  . Plantar fascitis    . QUADRICEPS TENDON REPAIR Left 04/19/2020   Procedure: REPAIR LEFT QUADRICEPS TENDON;  Surgeon: Leandrew Koyanagi, MD;  Location: [facility] [place];  Service: Orthopedics;  Laterality: Left;    There were no vitals filed for this visit.   Subjective Assessment - 12/18/20 0933    Subjective Pt. stated he was tired and forgot appointment earlier this week.  Pt. stated walking around [facility] (1.6 miles) some.  no pain indicated.    Pertinent History PE, SOB, HTN, Rt quad tendon repair,    Limitations Sitting;Standing;Walking    Patient Stated Goals Reduce pain, get back to normal    Currently in Pain? No/denies    Pain Onset More  than a month ago              [facility] PT Assessment - 12/18/20 0001      Assessment   Medical Diagnosis status post left quadriceps tendon repair and right partial patellectomy for patella fracture    Referring Provider (PT) Dwana Melena PA-C    Onset Date/Surgical Date 04/19/20      Observation/Other Assessments   Focus on Therapeutic Outcomes (FOTO)  update: 48%      Transfers   Five time sit to stand comments  10.24 seconds                         [facility] Adult PT Treatment/Exercise - 12/18/20 0001      Knee/Hip Exercises: Stretches   Gastroc Stretch 3 reps;30 seconds;Both   incline board     Knee/Hip Exercises: Aerobic   Nustep Lvl 10 10 mins      Knee/Hip Exercises: Machines for Strengthening   Total Gym Leg Press double leg 2 x 20 168 lbs, SL 118 lbs performed bilateral 2 x 15 each      Knee/Hip Exercises: Standing   Lateral Step Up Step Height: 6";Hand Hold: 2   2 x 15 eccentric lowering focus   Other Standing Knee Exercises squat over 18  inch chair c airex pad touch and stand 2 x10 eccentric control    Other Standing Knee Exercises heel to toe raises x 20      Knee/Hip Exercises: Seated   Sit to Sand 5 reps;without UE support   18 inch chair                   PT Short Term Goals - 12/09/20 0951      PT SHORT TERM GOAL #1   Title Patient will demonstrate independent use of home exercise program to maintain progress from in clinic treatments.    Time 3    Period Weeks    Status Achieved    Target Date 12/02/20             PT Long Term Goals - 12/09/20 0951      PT LONG TERM GOAL #1   Title Patient will demonstrate/report pain at worst less than or equal to 2/10 to facilitate minimal limitation in daily activity secondary to pain symptoms.    Time 8    Period Weeks    Status Achieved      PT LONG TERM GOAL #2   Title Patient will demonstrate independent use of home exercise program to facilitate ability to maintain/progress  functional gains from skilled physical therapy services.    Time 8    Period Weeks    Status On-going      PT LONG TERM GOAL #3   Title Pt. will demonstrate bilateral SLS > 15 seconds for improved stability in ambulation on uneven surfaces.    Time 8    Period Weeks    Status On-going      PT LONG TERM GOAL #4   Title Pt. will demonstrate ability to ascend/descend stairs for entry to apartment c reciprocal gait pattern s UE assist.    Time 8    Period Weeks    Status On-going      PT LONG TERM GOAL #5   Title Pt. will demonstrate FOTO outcome > = 52%    Time 8    Period Weeks    Status On-going                 Plan - 12/18/20 0953    Clinical Impression Statement Pt.'s attendance to therapy has been fairly inconsistent to this point with missed visits noted.  Overall, plan to continue strengthening in WB activity to promote improved function in stair navigation and encouragement to progress towards gym based activity for continued strengthening at this time.  FOTO assessment today did not show measureable change to this point so far.    Personal Factors and Comorbidities Comorbidity 2    Comorbidities PE, HTN    Examination-Activity Limitations Bathing;Sit;Squat;Stairs;Stand;Dressing;Transfers;Hygiene/Grooming;Locomotion Level;Bend    Examination-Participation Restrictions Community Activity;Driving;Occupation    Stability/Clinical Decision Making Evolving/Moderate complexity    Rehab Potential Good    PT Frequency --   1-2x/week   PT Duration 8 weeks    PT Treatment/Interventions ADLs/Self Care Home Management;Cryotherapy;Electrical Stimulation;Moist Heat;Balance training;Therapeutic exercise;Therapeutic activities;Stair training;Gait training;DME Instruction;Ultrasound;Neuromuscular re-education;Patient/family education;Orthotic Fit/Training;Manual techniques;Vasopneumatic Device;Taping;Dry needling;Passive range of motion;Iontophoresis 4mg /ml Dexamethasone;Functional  mobility training    PT Next Visit Plan Eccentric step down control, squat control, increasing independence towards gym activity in future.    PT Home Exercise Plan Access Code: [email or web address]    Consulted and Agree with Plan of Care Patient           Patient will benefit from skilled therapeutic intervention  in order to improve the following deficits and impairments:  Abnormal gait,Increased edema,Decreased knowledge of use of DME,Decreased strength,Pain,Decreased mobility,Decreased balance,Difficulty walking,Decreased range of motion,Decreased endurance,Decreased activity tolerance,Impaired perceived functional ability,Improper body mechanics,Decreased coordination  Visit Diagnosis: Chronic pain of left knee  Chronic pain of right knee  Muscle weakness (generalized)  Difficulty in walking, not elsewhere classified     Problem List Patient Active Problem List   Diagnosis Date Noted  . Quadriceps tendon rupture, left, initial encounter 05/03/2020  . Rupture, tendon, quadriceps, left, initial encounter 04/19/2020  . Displaced comminuted fracture of right patella, initial encounter for closed fracture 04/19/2020  . Pulmonary embolism without acute cor pulmonale ([place])   . Elevated troponin 06/22/2015  . Chest pain 06/22/2015  . SOB (shortness of breath) 06/22/2015  . AKI (acute kidney injury) ([place]) 06/22/2015  . Essential hypertension   . Back pain   . Sleep apnea     Scot Jun, PT, DPT, OCS, ATC 12/18/20  10:05 AM   PHYSICAL THERAPY DISCHARGE SUMMARY  Visits from Start of Care: 4  Current functional level related to goals / functional outcomes: See note   Remaining deficits: See note   Education / Equipment: HEP Plan: Patient agrees to discharge.  Patient goals were partially met. Patient is being discharged due to not returning since the last visit.  ?????     Scot Jun, PT, DPT, OCS, ATC 01/16/21  11:33 AM       [facility] Physical  Therapy [address] [place], [place], [address] Phone: [phone]   Fax:  [phone]  Name: Jacksyn Beeks MRN: [id number] Date of Birth: 06-26-1961

## 2021-04-25 ENCOUNTER — Ambulatory Visit (INDEPENDENT_AMBULATORY_CARE_PROVIDER_SITE_OTHER): Payer: Self-pay | Admitting: Orthopaedic Surgery

## 2021-04-25 ENCOUNTER — Ambulatory Visit: Payer: Self-pay

## 2021-04-25 ENCOUNTER — Encounter: Payer: Self-pay | Admitting: Orthopaedic Surgery

## 2021-04-25 ENCOUNTER — Other Ambulatory Visit: Payer: Self-pay

## 2021-04-25 ENCOUNTER — Ambulatory Visit (INDEPENDENT_AMBULATORY_CARE_PROVIDER_SITE_OTHER): Payer: Self-pay

## 2021-04-25 DIAGNOSIS — M25561 Pain in right knee: Secondary | ICD-10-CM

## 2021-04-25 DIAGNOSIS — G8929 Other chronic pain: Secondary | ICD-10-CM

## 2021-04-25 DIAGNOSIS — S76112A Strain of left quadriceps muscle, fascia and tendon, initial encounter: Secondary | ICD-10-CM

## 2021-04-25 DIAGNOSIS — M25562 Pain in left knee: Secondary | ICD-10-CM

## 2021-04-25 NOTE — Progress Notes (Signed)
Office Visit Note   Patient: Logan Mccarthy           Date of Birth: 17-Jun-1961           MRN: [id number] Visit Date: 04/25/2021              Requested by: No referring provider defined for this encounter. PCP: System, Provider Not In   Assessment & Plan: Visit Diagnoses:  1. Quadriceps tendon rupture, left, initial encounter   2. Chronic pain of both knees     Plan: Logan Mccarthy is now at [facility] and after careful review of the [place] [employer] the left knee has a rating of 15% which is 10% for the surgery and 5% for the range of motion.  For the right knee the rating is 50% which is 10% for the surgery of 40% for the decreased range of motion.  From my standpoint he can follow-up as needed.  Follow-Up Instructions: No follow-ups on file.   Orders:  Orders Placed This Encounter  Procedures   XR KNEE 3 VIEW LEFT   XR KNEE 3 VIEW RIGHT   No orders of the defined types were placed in this encounter.     Procedures: No procedures performed   Clinical Data: No additional findings.   Subjective: Chief Complaint  Patient presents with   Right Knee - Pain, Follow-up   Left Knee - Pain, Follow-up    Logan Mccarthy follows up today 1 year status post left quadriceps repair and right patellar tendon repair.  He still has some mild pain and has trouble going downstairs and actually has to go down backwards.  He has completed physical therapy.  He is in the process of applying for Social Security disability.   Review of Systems   Objective: Vital Signs: There were no vitals taken for this visit.  Physical Exam  Ortho Exam  Right knee shows fully healed surgical scar.  Passive range of motion is 0 to 110 degrees.  Active range of motion is 12 to 100 degrees with extensor lag.  Left knee shows a fully healed surgical scar.  Passive range of motion is 0 to 120 degrees and active range of motion is 0 to 110 degrees.  Specialty Comments:  No specialty comments  available.  Imaging: XR KNEE 3 VIEW LEFT  Result Date: 04/25/2021 Calcifications of the quadriceps and patellar tendon consistent with prior surgical treatment.  No acute abnormalities.  Degenerative changes  XR KNEE 3 VIEW RIGHT  Result Date: 04/25/2021 Small inferior bony fragment.  No acute abnormalities.  Posttraumatic and degenerative changes.    PMFS History: Patient Active Problem List   Diagnosis Date Noted   Quadriceps tendon rupture, left, initial encounter 05/03/2020   Rupture, tendon, quadriceps, left, initial encounter 04/19/2020   Displaced comminuted fracture of right patella, initial encounter for closed fracture 04/19/2020   Pulmonary embolism without acute cor pulmonale (HCC)    Elevated troponin 06/22/2015   Chest pain 06/22/2015   SOB (shortness of breath) 06/22/2015   AKI (acute kidney injury) (HCC) 06/22/2015   Essential hypertension    Back pain    Sleep apnea    Past Medical History:  Diagnosis Date   Avulsion of right patellar tendon    Back pain    Hx pulmonary embolism 2016   took xarelto x1 yr   Hypertension    Ruptured, tendon, quadriceps, left, initial encounter    Sleep apnea    does not use CPAP  Family History  Problem Relation Age of Onset   Hypertension Mother     Past Surgical History:  Procedure Laterality Date   Heel spurs     KNEE SURGERY Right    PATELLAR TENDON REPAIR Right 04/19/2020   Procedure: RIGHT PATELLA TENDON REPAIR;  Surgeon: Tarry Kos, MD;  Location: [facility];  Service: Orthopedics;  Laterality: Right;   Plantar fascitis     QUADRICEPS TENDON REPAIR Left 04/19/2020   Procedure: REPAIR LEFT QUADRICEPS TENDON;  Surgeon: Tarry Kos, MD;  Location: [facility];  Service: Orthopedics;  Laterality: Left;   Social History   Occupational History   Occupation: DJ  Tobacco Use   Smoking status: Never   Smokeless tobacco: Never  Substance and Sexual Activity   Alcohol use: No   Drug  use: No   Sexual activity: Yes    Birth control/protection: Condom

## 2021-08-22 ENCOUNTER — Ambulatory Visit: Payer: Self-pay

## 2021-10-16 ENCOUNTER — Ambulatory Visit: Payer: Self-pay

## 2023-05-29 ENCOUNTER — Encounter (HOSPITAL_COMMUNITY): Payer: Self-pay

## 2023-05-29 ENCOUNTER — Ambulatory Visit (HOSPITAL_COMMUNITY)
Admission: EM | Admit: 2023-05-29 | Discharge: 2023-05-29 | Disposition: A | Discharge status: Home / Self Care | Payer: Self-pay

## 2023-05-29 ENCOUNTER — Ambulatory Visit: Payer: Self-pay

## 2023-05-29 DIAGNOSIS — L239 Allergic contact dermatitis, unspecified cause: Secondary | ICD-10-CM

## 2023-05-29 MED ORDER — DEXAMETHASONE SODIUM PHOSPHATE 10 MG/ML IJ SOLN
10.0000 mg | Freq: Once | INTRAMUSCULAR | Status: AC
  Administered 2023-05-29: 10 mg via INTRAMUSCULAR

## 2023-05-29 MED ORDER — DEXAMETHASONE SODIUM PHOSPHATE 10 MG/ML IJ SOLN
INTRAMUSCULAR | Status: AC
  Filled 2023-05-29: qty 1

## 2023-05-29 NOTE — ED Triage Notes (Signed)
Patient here today with c/o itchy rash all over for 2-3 days. He has been using Benadryl with some relief.

## 2023-05-29 NOTE — Discharge Instructions (Signed)
You received a steroid shot today that should help with the rash.  You can take Zyrtec or Claritin daily to help with itching, take Benadryl as needed for itching at night as it can make you drowsy.  Return here for symptoms persist.

## 2023-05-29 NOTE — ED Provider Notes (Signed)
[facility]    CSN: [id number] Arrival date & time: 05/29/23  1341      History   Chief Complaint Chief Complaint  Patient presents with   Rash    HPI Logan Mccarthy is a 62 y.o. male.   Patient presents with itchy rash x 2 to 3 days.  Patient reports taking Benadryl and using over-the-counter anti-itch cream with minimal relief.   Rash   Past Medical History:  Diagnosis Date   Avulsion of right patellar tendon    Back pain    Hx pulmonary embolism 2016   took xarelto x1 yr   Hypertension    Ruptured, tendon, quadriceps, left, initial encounter    Sleep apnea    does not use CPAP    Patient Active Problem List   Diagnosis Date Noted   Quadriceps tendon rupture, left, initial encounter 05/03/2020   Rupture, tendon, quadriceps, left, initial encounter 04/19/2020   Displaced comminuted fracture of right patella, initial encounter for closed fracture 04/19/2020   Pulmonary embolism without acute cor pulmonale (HCC)    Elevated troponin 06/22/2015   Chest pain 06/22/2015   SOB (shortness of breath) 06/22/2015   AKI (acute kidney injury) (HCC) 06/22/2015   Essential hypertension    Back pain    Sleep apnea     Past Surgical History:  Procedure Laterality Date   Heel spurs     KNEE SURGERY Right    PATELLAR TENDON REPAIR Right 04/19/2020   Procedure: RIGHT PATELLA TENDON REPAIR;  Surgeon: Tarry Kos, MD;  Location: [facility];  Service: Orthopedics;  Laterality: Right;   Plantar fascitis     QUADRICEPS TENDON REPAIR Left 04/19/2020   Procedure: REPAIR LEFT QUADRICEPS TENDON;  Surgeon: Tarry Kos, MD;  Location: [facility];  Service: Orthopedics;  Laterality: Left;       Home Medications    Prior to Admission medications   Medication Sig Start Date End Date Taking? Authorizing Provider  losartan-hydrochlorothiazide (HYZAAR) 100-25 MG tablet Take 1 tablet by mouth daily. 04/19/23  Yes [provider]   omeprazole (PRILOSEC) 40 MG capsule Take 40 mg by mouth daily. 02/20/23  Yes [provider]  OZEMPIC, 1 MG/DOSE, 4 MG/3ML SOPN Inject 1 mg into the skin once a week. 05/12/23  Yes [provider]  rosuvastatin (CRESTOR) 10 MG tablet Take 10 mg by mouth daily. 05/09/23  Yes [provider]  amLODipine (NORVASC) 10 MG tablet Take 10 mg by mouth daily.    [provider]  Oxycodone HCl 20 MG TABS Take 1 tablet (20 mg total) by mouth 3 (three) times daily as needed (for pain). 04/26/20   Kathryne Hitch, MD  traMADol (ULTRAM) 50 MG tablet Take 1 tablet (50 mg total) by mouth 2 (two) times daily as needed. 05/31/20   Cristie Hem, PA-C    Family History Family History  Problem Relation Age of Onset   Hypertension Mother     Social History Social History   Tobacco Use   Smoking status: Never   Smokeless tobacco: Never  Substance Use Topics   Alcohol use: No   Drug use: No     Allergies   Patient has no known allergies.   Review of Systems Review of Systems  Skin:  Positive for rash.     Physical Exam Triage Vital Signs ED Triage Vitals  Encounter Vitals Group     BP 05/29/23 1426 135/89  Systolic BP Percentile --      Diastolic BP Percentile --      Pulse Rate 05/29/23 1426 65     Resp 05/29/23 1426 16     Temp 05/29/23 1426 98.2 F (36.8 C)     Temp Source 05/29/23 1426 Oral     SpO2 05/29/23 1426 97 %     Weight 05/29/23 1425 263 lb (119.3 kg)     Height 05/29/23 1425 5\' 9"  (1.753 m)     Head Circumference --      Peak Flow --      Pain Score 05/29/23 1425 0     Pain Loc --      Pain Education --      Exclude from Growth Chart --    No data found.  Updated Vital Signs BP 135/89 (BP Location: Right Arm)   Pulse 65   Temp 98.2 F (36.8 C) (Oral)   Resp 16   Ht 5\' 9"  (1.753 m)   Wt 263 lb (119.3 kg)   SpO2 97%   BMI 38.84 kg/m   Visual Acuity Right Eye Distance:   Left Eye Distance:   Bilateral  Distance:    Right Eye Near:   Left Eye Near:    Bilateral Near:     Physical Exam Vitals and nursing note reviewed.  Constitutional:      General: He is awake. He is not in acute distress.    Appearance: Normal appearance. He is well-developed and well-groomed. He is not ill-appearing, toxic-appearing or diaphoretic.  Skin:    General: Skin is warm and dry.     Findings: Erythema and rash present. No lesion or petechiae. Rash is urticarial.     Comments: Erythematous, urticarial rash noted to bilateral forearms, anterior torso, and face.  Neurological:     Mental Status: He is alert.  Psychiatric:        Behavior: Behavior is cooperative.      UC Treatments / Results  Labs (all labs ordered are listed, but only abnormal results are displayed) Labs Reviewed - No data to display  EKG   Radiology No results found.  Procedures Procedures (including critical care time)  Medications Ordered in UC Medications  dexamethasone (DECADRON) injection 10 mg (has no administration in time range)    Initial Impression / Assessment and Plan / UC Course  I have reviewed the triage vital signs and the nursing notes.  Pertinent labs & imaging results that were available during my care of the patient were reviewed by me and considered in my medical decision making (see chart for details).     Patient presented with itchy rash x 2 to 3 days.  Unsure of known allergens, but reports using different detergent recently.  Upon assessment patient has erythematous, urticarial rash noted to bilateral forearms, anterior torso, and face.  IM Decadron given in clinic per patient request.  Patient declined oral steroids.  Recommended Zyrtec/Claritin and Benadryl to help with itching.  Discussed switching back to regular detergent and washing clothes and linens.  Discussed return precautions. Final Clinical Impressions(s) / UC Diagnoses   Final diagnoses:  Allergic contact dermatitis,  unspecified trigger     Discharge Instructions      You received a steroid shot today that should help with the rash.  You can take Zyrtec or Claritin daily to help with itching, take Benadryl as needed for itching at night as it can make you drowsy.  Return here for symptoms  persist.    ED Prescriptions   None    PDMP not reviewed this encounter.   [name] Lawman A, NP 05/29/23 1452
